# Patient Record
Sex: Female | Born: 1995 | Race: Black or African American | Hispanic: No | Marital: Single | Smoking: Former smoker
Health system: Southern US, Community
[De-identification: ages and names within clinical notes are randomized; demographics above are authoritative.]

## PROBLEM LIST (undated history)

## (undated) DIAGNOSIS — G4730 Sleep apnea, unspecified: Secondary | ICD-10-CM

## (undated) DIAGNOSIS — F32A Depression, unspecified: Secondary | ICD-10-CM

## (undated) DIAGNOSIS — F419 Anxiety disorder, unspecified: Secondary | ICD-10-CM

## (undated) DIAGNOSIS — Z789 Other specified health status: Secondary | ICD-10-CM

## (undated) DIAGNOSIS — R45851 Suicidal ideations: Secondary | ICD-10-CM

## (undated) HISTORY — DX: Depression, unspecified: F32.A

## (undated) HISTORY — DX: Sleep apnea, unspecified: G47.30

## (undated) HISTORY — DX: Anxiety disorder, unspecified: F41.9

## (undated) HISTORY — PX: NO PAST SURGERIES: SHX2092

---

## 2017-04-22 ENCOUNTER — Emergency Department (HOSPITAL_COMMUNITY)
Admission: EM | Admit: 2017-04-22 | Discharge: 2017-04-22 | Disposition: A | Discharge status: Home / Self Care | Payer: Self-pay | Attending: Emergency Medicine | Admitting: Emergency Medicine

## 2017-04-22 ENCOUNTER — Encounter (HOSPITAL_COMMUNITY): Payer: Self-pay | Admitting: *Deleted

## 2017-04-22 DIAGNOSIS — Z79899 Other long term (current) drug therapy: Secondary | ICD-10-CM | POA: Insufficient documentation

## 2017-04-22 DIAGNOSIS — J029 Acute pharyngitis, unspecified: Secondary | ICD-10-CM | POA: Insufficient documentation

## 2017-04-22 DIAGNOSIS — B309 Viral conjunctivitis, unspecified: Secondary | ICD-10-CM | POA: Insufficient documentation

## 2017-04-22 DIAGNOSIS — F17290 Nicotine dependence, other tobacco product, uncomplicated: Secondary | ICD-10-CM | POA: Insufficient documentation

## 2017-04-22 DIAGNOSIS — J069 Acute upper respiratory infection, unspecified: Secondary | ICD-10-CM | POA: Insufficient documentation

## 2017-04-22 LAB — RAPID STREP SCREEN (MED CTR MEBANE ONLY): STREPTOCOCCUS, GROUP A SCREEN (DIRECT): NEGATIVE

## 2017-04-22 MED ORDER — DM-GUAIFENESIN ER 30-600 MG PO TB12: 1.0000 | ORAL_TABLET | Freq: Two times a day (BID) | ORAL | 0 refills | Status: DC | PRN

## 2017-04-22 MED ORDER — FLUTICASONE PROPIONATE 50 MCG/ACT NA SUSP: 2.0000 | Freq: Every day | NASAL | 0 refills | Status: DC

## 2017-04-22 MED ORDER — BENZONATATE 100 MG PO CAPS: 100.0000 mg | ORAL_CAPSULE | Freq: Three times a day (TID) | ORAL | 0 refills | Status: DC | PRN

## 2017-04-22 NOTE — Discharge Instructions (Signed)
1. Medications: flonase, mucinex, tessalon, usual home medications 2. Treatment: rest, drink plenty of fluids, take tylenol or ibuprofen for fever control. Use warm saltwater rinses and honey to relieve throat. 3. Follow Up: Please followup with your primary doctor in 3 days for discussion of your diagnoses and further evaluation after today's visit; if you do not have a primary care doctor use the resource guide provided to find one; Return to the ER for high fevers, difficulty breathing or other concerning symptoms   Contact a health care provider if: You are getting worse rather than better. Your symptoms are not controlled by medicine. You have chills. You have worsening shortness of breath. You have brown or red mucus. You have yellow or brown nasal discharge. You have pain in your face, especially when you bend forward. You have a fever. You have swollen neck glands. You have pain while swallowing. You have white areas in the back of your throat. Get help right away if: You have severe or persistent: Headache. Ear pain. Sinus pain. Chest pain. You have chronic lung disease and any of the following: Wheezing. Prolonged cough. Coughing up blood. A change in your usual mucus. You have a stiff neck. You have changes in your: Vision. Hearing. Thinking. Mood.

## 2017-04-22 NOTE — ED Triage Notes (Signed)
Pt here for URI which began with a sore throat a week ago.  Pt is also having intermittent chills (no fever at this time).  Pt has a cough and a headache and has been feeling generally unwell.

## 2017-04-22 NOTE — ED Notes (Signed)
Pt reports lots of post nasal drip and seasonal allergies.  She states that "the pollen is the main issue"

## 2017-04-22 NOTE — ED Provider Notes (Signed)
[facility]-EMERGENCY DEPT Provider Note   CSN: [id number] Arrival date & time: 04/22/17  1153  History   Chief Complaint Chief Complaint  Patient presents with  . Sore Throat   HPI Comments: Haley Bentley is a 21 y.o. female who presents to the Emergency Department with multiple complaints. Pt primarily complains of a sore throat and sinus pressure x1 week, in addition to fever, nasal congestion and intermittent chills in the setting of positive sick contact with a child, nausea and a productive cough with yellow mucus. Also reports eye redness, with no pain, itching or swelling, since this morning. She denies experiencing any vomiting or abdominal pain. She has not used any OTC medications to treat her sx.  The history is provided by the patient and medical records. No language interpreter was used.   History reviewed. No pertinent past medical history.  There are no active problems to display for this patient.  History reviewed. No pertinent surgical history.  OB History    No data available      Home Medications    Prior to Admission medications   Medication Sig Start Date End Date Taking? Authorizing Provider  benzonatate (TESSALON) 100 MG capsule Take 1 capsule (100 mg total) by mouth 3 (three) times daily as needed for cough. 04/22/17   Delayza Lungren Manuel [place], [place]  dextromethorphan-guaiFENesin (MUCINEX DM) 30-600 MG 12hr tablet Take 1 tablet by mouth 2 (two) times daily as needed for cough. 04/22/17   Viral Schramm Manuel [place], [place]  fluticasone (FLONASE) 50 MCG/ACT nasal spray Place 2 sprays into both nostrils daily. 04/22/17   Roney Youtz Orson [place], [place]   Family History No family history on file.  Social History Social History  Substance Use Topics  . Smoking status: Current Some Day Smoker    Types: Cigars  . Smokeless tobacco: Never Used  . Alcohol use No    Allergies   Patient has no known allergies.  Review of Systems Review of Systems  Constitutional: Positive for  chills and fever. Negative for appetite change and fatigue.  HENT: Positive for congestion, sinus pain, sinus pressure and sore throat. Negative for ear discharge and ear pain.   Eyes: Positive for redness. Negative for photophobia, pain, discharge, itching and visual disturbance.  Respiratory: Positive for cough. Negative for shortness of breath and wheezing.   Cardiovascular: Negative for chest pain.  Gastrointestinal: Positive for nausea. Negative for abdominal pain, diarrhea and vomiting.  Genitourinary: Negative for difficulty urinating and dysuria.  Skin: Negative for rash.   Physical Exam Updated Vital Signs BP 105/75 (BP Location: Left Arm)   Pulse 91   Temp 98.7 F (37.1 C)   Resp 16   Wt 54.9 kg   LMP 04/20/2017   SpO2 100%   Physical Exam  Constitutional: She is oriented to person, place, and time. She appears well-developed and well-nourished. No distress.  Well appearing. Airway patent. No stridor, trismus, drooling. Handling secretions well.  HENT:  Head: Normocephalic and atraumatic.  Right Ear: External ear normal.  Left Ear: External ear normal.  Nose: Nose normal.  Mouth/Throat: Oropharynx is clear and moist. No oropharyngeal exudate.  Oropharynx without evidence of redness or exudates. Tonsils without evidence of obvious redness, swelling, or exudates. TM's appear normal with no evidence of bulging. EAC appear non erythematous and not swollen  Eyes: Conjunctivae and EOM are normal. Pupils are equal, round, and reactive to light.  Neck: Normal range of motion.  Normal ROM. No nuchal rigidity.   Cardiovascular: Normal  rate and normal heart sounds.   Pulmonary/Chest: Effort normal and breath sounds normal. No respiratory distress. She has no wheezes. She has no rales.  Lungs CTA. No wheezing. No rales. No stridor. Normal work of breathing  Abdominal: Soft. She exhibits no distension. There is no tenderness. There is no rebound and no guarding.  Soft and nontender.  No rebound. No guarding. Negative murphy's sign. No focal tenderness at McBurney's point. No CVA tenderness. No evidence of hernia  Lymphadenopathy:    She has no cervical adenopathy.  Neurological: She is alert and oriented to person, place, and time.  Skin: Skin is warm and dry. Capillary refill takes less than 2 seconds.  Psychiatric: She has a normal mood and affect. Her behavior is normal.  Nursing note and vitals reviewed.   ED Treatments / Results  DIAGNOSTIC STUDIES:  Oxygen Saturation is 100% on room air, normal by my interpretation.    COORDINATION OF CARE:  12:34 PM Discussed treatment plan with pt at bedside and pt agreed to plan.  Labs (all labs ordered are listed, but only abnormal results are displayed) Labs Reviewed  RAPID STREP SCREEN (NOT AT [facility])  CULTURE, GROUP A STREP [facility])   EKG  EKG Interpretation None      Radiology No results found.  Procedures Procedures (including critical care time)  Medications Ordered in ED Medications - No data to display   Initial Impression / Assessment and Plan / ED Course  I have reviewed the triage vital signs and the nursing notes.  Pertinent labs & imaging results that were available during my care of the patient were reviewed by me and considered in my medical decision making (see chart for details).    Pt symptoms consistent with URI, viral pharyngitis, and viral conjuntivitis. Pt with negative strep. No abx indicated at this time. Discussed that results of strep culture are pending and patient will be informed if positive result and abx will be called in at that time. Discharge with symptomatic tx. No evidence of dehydration. Pt is tolerating secretions. Normal visual acuity test. Presentation not concerning for peritonsillar abscess or spread of infection to deep spaces of the throat; patent airway.  No purulent eye discharge, corneal abrasions, entrapment, consensual photophobia. Presentation non-concerning for  iritis, bacterial conjunctivitis, corneal abrasions, or HSV.  No antibiotics are indicated.  Personal hygiene and frequent handwashing discussed. Specific return precautions discussed. Recommended PCP follow up. Pt appears safe for discharge.   Final Clinical Impressions(s) / ED Diagnoses   Final diagnoses:  Viral upper respiratory tract infection  Viral pharyngitis  Acute viral conjunctivitis of right eye    New Prescriptions New Prescriptions   BENZONATATE (TESSALON) 100 MG CAPSULE    Take 1 capsule (100 mg total) by mouth 3 (three) times daily as needed for cough.   DEXTROMETHORPHAN-GUAIFENESIN (MUCINEX DM) 30-600 MG 12HR TABLET    Take 1 tablet by mouth 2 (two) times daily as needed for cough.   FLUTICASONE (FLONASE) 50 MCG/ACT NASAL SPRAY    Place 2 sprays into both nostrils daily.   I personally performed the services described in this documentation, which was scribed in my presence. The recorded information has been reviewed and is accurate.  By signing my name below, I, Phillips Climes, attest that this documentation has been prepared under the direction and in the presence of Dorian Duval, [place]. Electronically Signed: Phillips Climes, Scribe. 04/22/2017. 12:56 PM.   [address] [place], [place] 04/22/17 1256    Ankit  Rhunette Croft, MD 04/23/17 0981

## 2017-04-25 LAB — CULTURE, GROUP A STREP (THRC)

## 2018-06-23 ENCOUNTER — Emergency Department (HOSPITAL_COMMUNITY)
Admission: EM | Admit: 2018-06-23 | Discharge: 2018-06-24 | Disposition: A | Discharge status: Other Acute Inpatient Hospital | Payer: Medicaid Other | Attending: Emergency Medicine | Admitting: Emergency Medicine

## 2018-06-23 ENCOUNTER — Encounter (HOSPITAL_COMMUNITY): Payer: Self-pay

## 2018-06-23 ENCOUNTER — Other Ambulatory Visit: Payer: Self-pay

## 2018-06-23 DIAGNOSIS — F322 Major depressive disorder, single episode, severe without psychotic features: Secondary | ICD-10-CM | POA: Insufficient documentation

## 2018-06-23 DIAGNOSIS — F17290 Nicotine dependence, other tobacco product, uncomplicated: Secondary | ICD-10-CM | POA: Insufficient documentation

## 2018-06-23 DIAGNOSIS — R45851 Suicidal ideations: Secondary | ICD-10-CM | POA: Insufficient documentation

## 2018-06-23 LAB — COMPREHENSIVE METABOLIC PANEL
ALBUMIN: 4.4 g/dL (ref 3.5–5.0)
ALT: 12 U/L (ref 0–44)
AST: 20 U/L (ref 15–41)
Alkaline Phosphatase: 56 U/L (ref 38–126)
Anion gap: 8 (ref 5–15)
BILIRUBIN TOTAL: 0.7 mg/dL (ref 0.3–1.2)
BUN: 8 mg/dL (ref 6–20)
CO2: 26 mmol/L (ref 22–32)
CREATININE: 0.78 mg/dL (ref 0.44–1.00)
Calcium: 9.4 mg/dL (ref 8.9–10.3)
Chloride: 105 mmol/L (ref 98–111)
GFR calc Af Amer: >60 mL/min (ref >60)
GLUCOSE: 91 mg/dL (ref 70–99)
Potassium: 4 mmol/L (ref 3.5–5.1)
Sodium: 139 mmol/L (ref 135–145)
TOTAL PROTEIN: 7.4 g/dL (ref 6.5–8.1)

## 2018-06-23 LAB — CBC
HEMATOCRIT: 42.3 % (ref 36.0–46.0)
Hemoglobin: 13.1 g/dL (ref 12.0–15.0)
MCH: 32 pg (ref 26.0–34.0)
MCHC: 31 g/dL (ref 30.0–36.0)
MCV: 103.4 fL — AB (ref 78.0–100.0)
PLATELETS: 164 10*3/uL (ref 150–400)
RBC: 4.09 MIL/uL (ref 3.87–5.11)
RDW: 11.9 % (ref 11.5–15.5)
WBC: 5.7 10*3/uL (ref 4.0–10.5)

## 2018-06-23 LAB — ETHANOL

## 2018-06-23 LAB — I-STAT BETA HCG BLOOD, ED (MC, WL, AP ONLY): I-stat hCG, quantitative: <5 m[IU]/mL (ref <5)

## 2018-06-23 LAB — ACETAMINOPHEN LEVEL: Acetaminophen (Tylenol), Serum: <10 ug/mL — ABNORMAL LOW (ref 10–30)

## 2018-06-23 LAB — SALICYLATE LEVEL: Salicylate Lvl: <7 mg/dL (ref 2.8–30.0)

## 2018-06-23 MED ORDER — ZOLPIDEM TARTRATE 5 MG PO TABS: 5.0000 mg | ORAL_TABLET | Freq: Every evening | ORAL | Status: DC | PRN

## 2018-06-23 MED ORDER — ONDANSETRON HCL 4 MG PO TABS: 4.0000 mg | ORAL_TABLET | Freq: Three times a day (TID) | ORAL | Status: DC | PRN

## 2018-06-23 MED ORDER — ALUM & MAG HYDROXIDE-SIMETH 200-200-20 MG/5ML PO SUSP: 30.0000 mL | Freq: Four times a day (QID) | ORAL | Status: DC | PRN

## 2018-06-23 MED ORDER — NICOTINE 21 MG/24HR TD PT24: 21.0000 mg | MEDICATED_PATCH | Freq: Every day | TRANSDERMAL | Status: DC

## 2018-06-23 MED ORDER — ACETAMINOPHEN 325 MG PO TABS: 650.0000 mg | ORAL_TABLET | ORAL | Status: DC | PRN

## 2018-06-23 NOTE — ED Provider Notes (Signed)
[facility] Provider [facility]   CSN: [id number] Arrival date & time: 06/23/18  1735     History   Chief Complaint Chief Complaint  Patient presents with  . Suicidal    HPI Haley Bentley is a 22 y.o. female with a hx of tobacco abuse who presents to the ED with suicidal ideations which have been occurring intermittently for 1 month.  Patient states that thoughts of hurting herself, no specific plan.  She states these thoughts of been progressively worsening.  States when she gets angry she does not know how to handle it.  She states that her boyfriend broke up about a month ago, and she has been stressed in general.  No other specific alleviating or aggravating factors.  Denies previous suicide attempt.  Denies homicidal ideation.  Denies hallucinations.  Denies fever, chills, vomiting, abdominal pain, chest pain, or trouble breathing. HPI  History reviewed. No pertinent past medical history.  There are no active problems to display for this patient.   History reviewed. No pertinent surgical history.   OB History   None      Home Medications    Prior to Admission medications   Medication Sig Start Date End Date Taking? Authorizing Provider  benzonatate (TESSALON) 100 MG capsule Take 1 capsule (100 mg total) by mouth 3 (three) times daily as needed for cough. Patient not taking: Reported on 06/23/2018 04/22/17   Alvina ChouEspina, Francisco Manuel, [place]  dextromethorphan-guaiFENesin [facility](MUCINEX DM) 30-600 MG 12hr tablet Take 1 tablet by mouth 2 (two) times daily as needed for cough. Patient not taking: Reported on 06/23/2018 04/22/17   Alvina ChouEspina, Francisco Manuel, [place]  fluticasone [facility](FLONASE) 50 MCG/ACT nasal spray Place 2 sprays into both nostrils daily. Patient not taking: Reported on 06/23/2018 04/22/17   Alvina ChouEspina, Francisco Manuel, [place]    Family History No family history on file.  Social History Social History   Tobacco Use  . Smoking status: Current Some Day  Smoker    Types: Cigars  . Smokeless tobacco: Never Used  Substance Use Topics  . Alcohol use: No  . Drug use: Yes     Allergies   Patient has no known allergies.   Review of Systems Review of Systems  Constitutional: Negative for chills and fever.  Respiratory: Negative for shortness of breath.   Cardiovascular: Negative for chest pain.  Gastrointestinal: Negative for abdominal pain.  Psychiatric/Behavioral: Positive for suicidal ideas. Negative for hallucinations.  All other systems reviewed and are negative.    Physical Exam Updated Vital Signs BP 103/65 (BP Location: Right Arm)   Pulse 61   Temp 97.7 F (36.5 C) (Oral)   Resp 16   Ht 5\' 5"  (1.651 m)   Wt 59 kg (130 lb)   LMP 06/09/2018   SpO2 100%   BMI 21.63 kg/m   Physical Exam  Constitutional: She appears well-developed and well-nourished. No distress.  HENT:  Head: Normocephalic and atraumatic.  Eyes: Conjunctivae are normal. Right eye exhibits no discharge. Left eye exhibits no discharge.  Cardiovascular: Normal rate and regular rhythm.  No murmur heard. Pulmonary/Chest: Breath sounds normal. No respiratory distress. She has no wheezes. She has no rales.  Abdominal: Soft. She exhibits no distension. There is no tenderness.  Neurological: She is alert.  Clear speech.   Skin: Skin is warm and dry. No rash noted.  Psychiatric: She has a normal mood and affect. Her speech is normal and behavior is normal. She expresses suicidal (Intermittently) ideation. She  expresses no homicidal ideation. She expresses no suicidal plans and no homicidal plans.  Nursing note and vitals reviewed.    ED Treatments / Results  Labs Results for orders placed or performed during the hospital encounter of 06/23/18  Comprehensive metabolic panel  Result Value Ref Range   Sodium 139 135 - 145 mmol/L   Potassium 4.0 3.5 - 5.1 mmol/L   Chloride 105 98 - 111 mmol/L   CO2 26 22 - 32 mmol/L   Glucose, Bld 91 70 - 99 mg/dL    BUN 8 6 - 20 mg/dL   Creatinine, Ser 1.61 0.44 - 1.00 mg/dL   Calcium 9.4 8.9 - 09.6 mg/dL   Total Protein 7.4 6.5 - 8.1 g/dL   Albumin 4.4 3.5 - 5.0 g/dL   AST 20 15 - 41 U/L   ALT 12 0 - 44 U/L   Alkaline Phosphatase 56 38 - 126 U/L   Total Bilirubin 0.7 0.3 - 1.2 mg/dL   GFR calc non Af Amer >60 >60 mL/min   GFR calc Af Amer >60 >60 mL/min   Anion gap 8 5 - 15  Ethanol  Result Value Ref Range   Alcohol, Ethyl (B) <10 <10 mg/dL  Salicylate level  Result Value Ref Range   Salicylate Lvl <7.0 2.8 - 30.0 mg/dL  Acetaminophen level  Result Value Ref Range   Acetaminophen (Tylenol), Serum <10 (L) 10 - 30 ug/mL  cbc  Result Value Ref Range   WBC 5.7 4.0 - 10.5 K/uL   RBC 4.09 3.87 - 5.11 MIL/uL   Hemoglobin 13.1 12.0 - 15.0 g/dL   HCT 04.5 40.9 - 81.1 %   MCV 103.4 (H) 78.0 - 100.0 fL   MCH 32.0 26.0 - 34.0 pg   MCHC 31.0 30.0 - 36.0 g/dL   RDW 91.4 78.2 - 95.6 %   Platelets 164 150 - 400 K/uL  I-Stat beta hCG blood, ED  Result Value Ref Range   I-stat hCG, quantitative <5.0 <5 mIU/mL   Comment 3           No results found. EKG None  Radiology No results found.  Procedures Procedures (including critical care time)  Medications Ordered in ED Medications - No data to display   Initial Impression / Assessment and Plan / ED Course  I have reviewed the triage vital signs and the nursing notes.  Pertinent labs & imaging results that were available during my care of the patient were reviewed by me and considered in my medical decision making (see chart for details).   Patient presents with complaint of progressively worsening intermittent suicidal ideation.  Patient nontoxic-appearing, no apparent distress, vitals WNL.  Labs reviewed and grossly unremarkable.  Patient is medically clear for TTS evaluation.  Psych holding orders have been placed. Disposition per behavioral health.   Final Clinical Impressions(s) / ED Diagnoses   Final diagnoses:  Suicidal ideation     ED Discharge Orders    None       Desmond Lope 06/23/18 2042    Pricilla Loveless, MD 06/24/18 1710

## 2018-06-23 NOTE — ED Triage Notes (Signed)
Pt states she is newly homeless and having a lot of anxiety, feeling jumpy and feeling suicidal. States she has been feeling suicidal for one month, denies having a plan. Pt reports that she use to cut herself "a long time ago but not recently".
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
## 2018-06-23 NOTE — ED Notes (Addendum)
[facility], states that pt is recommended for inpatient treatment.

## 2018-06-23 NOTE — ED Provider Notes (Signed)
Patient placed in Quick Look pathway, seen and evaluated   Chief Complaint: + anxiety, depression, feeling suicidal/   HPI:   Patient states she has racing thoughts she cannot stop. Difficulty sleeping. Feeling suicidal. Denies planno previous hospitalization.  ROS:  anxiety (one)  Physical Exam:   Gen: No distress  Neuro: Awake and Alert  Skin: Warm    Focused Exam:RRR, CTAB   Initiation of care has begun. The patient has been counseled on the process, plan, and necessity for staying for the completion/evaluation, and the remainder of the medical screening examination    Arthor CaptainHarris, Shanyiah Conde, Cordelia Poche-C 06/23/18 Su [id number]    Mesner, Jason, MD 06/24/18 [phone]

## 2018-06-23 NOTE — ED Notes (Signed)
Pt has been wanded by security. 

## 2018-06-23 NOTE — ED Notes (Signed)
[facility] in 2 wheelchairs

## 2018-06-23 NOTE — ED Notes (Signed)
Pt belongings in 2 wheelchairs [facility]. Has not been inventoried yet.

## 2018-06-23 NOTE — ED Notes (Signed)
Father would like to speak with daughter. plz call Ellis SavageLamonta Clark @ [phone]

## 2018-06-24 ENCOUNTER — Inpatient Hospital Stay (HOSPITAL_COMMUNITY)
Admission: AD | Admit: 2018-06-24 | Discharge: 2018-06-28 | DRG: 885 | Disposition: A | Discharge status: Home / Self Care | Payer: Medicaid Other | Source: Intra-hospital | Attending: Psychiatry | Admitting: Psychiatry

## 2018-06-24 ENCOUNTER — Encounter (HOSPITAL_COMMUNITY): Payer: Self-pay

## 2018-06-24 DIAGNOSIS — Z818 Family history of other mental and behavioral disorders: Secondary | ICD-10-CM

## 2018-06-24 DIAGNOSIS — F909 Attention-deficit hyperactivity disorder, unspecified type: Secondary | ICD-10-CM | POA: Diagnosis present

## 2018-06-24 DIAGNOSIS — F17290 Nicotine dependence, other tobacco product, uncomplicated: Secondary | ICD-10-CM | POA: Diagnosis present

## 2018-06-24 DIAGNOSIS — R4587 Impulsiveness: Secondary | ICD-10-CM | POA: Diagnosis present

## 2018-06-24 DIAGNOSIS — F1290 Cannabis use, unspecified, uncomplicated: Secondary | ICD-10-CM | POA: Diagnosis not present

## 2018-06-24 DIAGNOSIS — G4700 Insomnia, unspecified: Secondary | ICD-10-CM | POA: Diagnosis not present

## 2018-06-24 DIAGNOSIS — F4310 Post-traumatic stress disorder, unspecified: Secondary | ICD-10-CM | POA: Diagnosis present

## 2018-06-24 DIAGNOSIS — R450 Nervousness: Secondary | ICD-10-CM

## 2018-06-24 DIAGNOSIS — F332 Major depressive disorder, recurrent severe without psychotic features: Secondary | ICD-10-CM | POA: Diagnosis present

## 2018-06-24 DIAGNOSIS — Z62810 Personal history of physical and sexual abuse in childhood: Secondary | ICD-10-CM | POA: Diagnosis present

## 2018-06-24 DIAGNOSIS — R45851 Suicidal ideations: Secondary | ICD-10-CM | POA: Diagnosis present

## 2018-06-24 HISTORY — DX: Other specified health status: Z78.9

## 2018-06-24 LAB — RAPID URINE DRUG SCREEN, HOSP PERFORMED
Amphetamines: NOT DETECTED
Benzodiazepines: NOT DETECTED
COCAINE: NOT DETECTED
OPIATES: NOT DETECTED
TETRAHYDROCANNABINOL: POSITIVE — AB

## 2018-06-24 LAB — LIPID PANEL
CHOL/HDL RATIO: 2.7 ratio
CHOLESTEROL: 149 mg/dL (ref 0–200)
HDL: 55 mg/dL (ref >40)
LDL Cholesterol: 87 mg/dL (ref 0–99)
Triglycerides: 36 mg/dL (ref <150)
VLDL: 7 mg/dL (ref 0–40)

## 2018-06-24 LAB — HEMOGLOBIN A1C
Hgb A1c MFr Bld: 4.5 % — ABNORMAL LOW (ref 4.8–5.6)
Mean Plasma Glucose: 82.45 mg/dL

## 2018-06-24 LAB — TSH: TSH: 0.648 u[IU]/mL (ref 0.350–4.500)

## 2018-06-24 MED ORDER — TRAZODONE HCL 50 MG PO TABS: 50.0000 mg | ORAL_TABLET | Freq: Every evening | ORAL | Status: DC | PRN

## 2018-06-24 MED ORDER — ACETAMINOPHEN 325 MG PO TABS
650.0000 mg | ORAL_TABLET | Freq: Four times a day (QID) | ORAL | Status: DC | PRN
Start: 2018-06-24 — End: 2018-06-29

## 2018-06-24 MED ORDER — MAGNESIUM HYDROXIDE 400 MG/5ML PO SUSP: 30.0000 mL | Freq: Every day | ORAL | Status: DC | PRN

## 2018-06-24 MED ORDER — MIRTAZAPINE 15 MG PO TABS
15.0000 mg | ORAL_TABLET | Freq: Every day | ORAL | Status: DC
  Administered 2018-06-25 – 2018-06-27 (×2): 15 mg via ORAL
  Filled 2018-06-24 (×3): qty 1
  Filled 2018-06-24: qty 7
  Filled 2018-06-24 (×3): qty 1

## 2018-06-24 MED ORDER — ADULT MULTIVITAMIN W/MINERALS CH
1.0000 | ORAL_TABLET | Freq: Every day | ORAL | Status: DC
  Administered 2018-06-24 – 2018-06-28 (×5): 1 via ORAL
  Filled 2018-06-24 (×9): qty 1

## 2018-06-24 MED ORDER — HYDROXYZINE HCL 25 MG PO TABS
25.0000 mg | ORAL_TABLET | Freq: Three times a day (TID) | ORAL | Status: DC | PRN
  Administered 2018-06-24 – 2018-06-27 (×2): 25 mg via ORAL
  Filled 2018-06-24 (×2): qty 1

## 2018-06-24 MED ORDER — ALUM & MAG HYDROXIDE-SIMETH 200-200-20 MG/5ML PO SUSP
30.0000 mL | ORAL | Status: DC | PRN
  Administered 2018-06-24: 30 mL via ORAL
  Filled 2018-06-24: qty 30

## 2018-06-24 NOTE — [facility] Group Notes (Signed)
[facility] Group Notes:  (Nursing/MHT/Case Management/Adjunct)  Date:  06/24/2018  Time:  5:53 PM  Type of Therapy:  Psychoeducational Skills  Participation Level:  Did Not Attend    Haley Bentley 06/24/2018, 5:53 PM

## 2018-06-24 NOTE — Tx Team (Signed)
Initial Treatment Plan 06/24/2018 3:26 AM Haley Dione PloverA Tavenner [id number]    PATIENT STRESSORS: Educational concerns Financial difficulties Loss of relationship Occupational concerns   PATIENT STRENGTHS: Ability for insight Active sense of humor Average or above average intelligence   PATIENT IDENTIFIED PROBLEMS: "I want to talk about my pain."  "I need help with my anxiety/depression."  "I need coping skills for good communication."                 DISCHARGE CRITERIA:  Ability to meet basic life and health needs Adequate post-discharge living arrangements Improved stabilization in mood, thinking, and/or behavior  PRELIMINARY DISCHARGE PLAN: Placement in alternative living arrangements  PATIENT/FAMILY INVOLVEMENT: This treatment plan has been presented to and reviewed with the patient, Haley A Riese.  The patient and family have been given the opportunity to ask questions and make suggestions.  Jonetta SpeakAshton E Royden Bulman, RN 06/24/2018, 3:26 AM

## 2018-06-24 NOTE — Plan of Care (Signed)
  Problem: Activity: Goal: Interest or engagement in activities will improve Outcome: Not Progressing   Problem: Safety: Goal: Periods of time without injury will increase Outcome: Progressing   Problem: Safety: Goal: Periods of time without injury will increase Outcome: Progressing DAR NOTE: Patient presents with anxious affect and depressed mood.  Denies suicidal thoughts, auditory and visual hallucinations.  Rates depression at 7, hopelessness at 6, and anxiety at 10.  Maintained on routine safety checks.  Medications given as prescribed.  Support and encouragement offered as needed.  Attended group and participated.  States goal for today is "lighten up my mood and set more goals."  Patient remained in her room most of this shift.  Minimal interaction with staff and peers.  Vistaril 25 mg given for complain of anxiety with good effect.
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
## 2018-06-24 NOTE — [facility] Group Notes (Signed)
Adult Psychoeducational Group Note  Date:  06/24/2018 Time:  4:19 PM  Group Topic/Focus:  Goals Group:   The focus of this group is to help patients establish daily goals to achieve during treatment and discuss how the patient can incorporate goal setting into their daily lives to aide in recovery.  Participation Level:  Active  Participation Quality:  Appropriate  Affect:  Appropriate  Cognitive:  Alert  Insight: Good  Engagement in Group:  Engaged  Modes of Intervention:  Discussion  Additional Comments: Patient did not attend orientation/goal group, but was encouraged to do so. Although, patient did not attend goals group patient did attend psycho-ed group and participated as well as engaged in group activity on crisis management in the adult unit workbook. Patient was able to identify feelings leading up to a crisis situation.      Brayton LaymanBreannah R Deosha Werden 06/24/2018, 4:19 PM

## 2018-06-24 NOTE — Progress Notes (Signed)
Patient presents with sad/flat/depressed/guarded affect and behavior during admission interview and assessment. VS monitored and recorded. [facility] and revealed skin clean, dry, and intact. Contraband was not found. Patient was oriented to unit and schedule. Pt states ""I want to talk about my pain. I need help with my anxiety/depression. I need coping skills for good communication.". Pt denies SI/HI/AVH at this time. PO fluids provided. Safety maintained. Rest encouraged.
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
## 2018-06-24 NOTE — [facility] Suicide Risk Assessment (Signed)
[facility] INPATIENT:  Family/Significant Other Suicide Prevention Education  Suicide Prevention Education:   Education Completed; Haley Bentley/Boyfriend, has been identified by the patient as the family member/significant other with whom the patient will be residing, and identified as the person(s) who will aid the patient in the event of a mental health crisis (suicidal ideations/suicide attempt).  With written consent from the patient, the family member/significant other has been provided the following suicide prevention education, prior to the and/or following the discharge of the patient.  The suicide prevention education provided includes the following:  Suicide risk factors  Suicide prevention and interventions  National Suicide Hotline telephone number  [facility] assessment telephone number  [facility] Emergency Assistance 911  [facility] and/or Residential Mobile Crisis Unit telephone number  Request made of family/significant other to:  Remove weapons (e.g., guns, rifles, knives), all items previously/currently identified as safety concern.    Remove drugs/medications (over-the-counter, prescriptions, illicit drugs), all items previously/currently identified as a safety concern.  The family member/significant other verbalizes understanding of the suicide prevention education information provided.  The family member/signthificant other agrees to remove the items of safety concern listed above. Boyfriend stated there are no guns or weapons in the home. He stated that patient has been depressed for a little while and he is concerned. He stated that he would like for patient to receive outpatient therapy when she discharges.   Haley Bentley, MSW, LCSW Clinical Social Work 06/24/2018, 1:47 PM

## 2018-06-24 NOTE — Progress Notes (Signed)
NUTRITION ASSESSMENT  Pt identified as at risk on the Malnutrition Screen Tool  INTERVENTION: - Supplements: will order daily multivitamin with minerals.   NUTRITION DIAGNOSIS: Inadequate oral intake related to decreased appetite and interest in eating as evidenced by patient report.   Goal: Pt to meet >/= 90% of their estimated nutrition needs.  Monitor:  PO intake  Assessment:  Patient admitted for severe anxiety and SI for the past 1 month. She has also been experiencing racing thoughts with associated inability to sleep, hopelessness, decreased concentration, fatigue, irritability, and decreased appetite and intakes. She reports being newly homeless (since admission).  Per chart review, limited weight hx available. Continue to encourage PO intakes of meals and snacks.    22 y.o. female  Height: Ht Readings from Last 1 Encounters:  06/24/18 5\' 5"  (1.651 m)    Weight: Wt Readings from Last 1 Encounters:  06/24/18 129 lb 15.7 oz (59 kg)    Weight Hx: Wt Readings from Last 10 Encounters:  06/24/18 129 lb 15.7 oz (59 kg)  06/23/18 130 lb (59 kg)  04/22/17 121 lb (54.9 kg) (37 %, Z= -0.33)*   * Growth percentiles are based on CDC (Girls, 2-20 Years) data.    BMI:  Body mass index is 21.63 kg/m. Pt meets criteria for normal based on current BMI.  Estimated Nutritional Needs: Kcal: 25-30 kcal/kg Protein: > 1 gram protein/kg Fluid: 1 ml/kcal  Diet Order:  Diet Order           Diet regular Room service appropriate? Yes; Fluid consistency: Thin  Diet effective now         Pt is also offered choice of unit snacks mid-morning and mid-afternoon.  Pt is eating as desired.   Lab results and medications reviewed.     Trenton GammonJessica Toris Laverdiere, MS, RD, LDN, [facility] Inpatient Clinical Dietitian Pager # [phone] After hours/weekend pager # [phone]

## 2018-06-24 NOTE — ED Notes (Signed)
[facility] called and pt has been accepted. Will be going to room 405-1. Dr. Jama Flavorsobos accepting. Pt signed voluntary admission form.
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
## 2018-06-25 NOTE — Progress Notes (Signed)
The focus of this group is to help patients review their daily goal of treatment and discuss progress on daily workbooks. Pt did not attend the evening group, though she was invited. "I can't go. I need to shower."

## 2018-06-25 NOTE — Progress Notes (Signed)
D Pt I seen OOB UAL on the 400 hall today. She is shy, she is quiet, she endorses a flat and depressed affect. She avoids eye contact with this Clinical research associatewriter.    A She does complete her daily assessment and on this she wrote she denied SI today and she rated her depression, hopelessness and anxeity " 8/5/7", respectively. She said she felt " bad" this evening and was allowed to stay in her bed for dinner and a meal was brought back to her by staff.    R Safety is in place.

## 2018-06-25 NOTE — Progress Notes (Signed)
D: Patient observed on telephone and states she talked with her family. Boyfriend also visited. Patient states she is feeling more hopeful, optimistic based on these interactions. Patient's affect animated, superficial with congruent mood. Some anxiety noted.  Denies pain, physical complaints.   A: Medicated per orders, no prns requested or required. Medication education provided. Level III obs in place for safety. Emotional support offered. Patient encouraged to complete Suicide Safety Plan before discharge. Encouraged to attend and participate in unit programming.     R: Patient verbalizes understanding of POC. Patient denies SI/HI/AVH and remains safe on level III obs. Will continue to monitor throughout the night.

## 2018-06-25 NOTE — Progress Notes (Signed)
Recreation Therapy Notes  Date: 6.28.19 Time: 0930 Location: 300 [address] Dayroom  Group Topic: Stress Management  Goal Area(s) Addresses:  Patient will verbalize importance of using healthy stress management.  Patient will identify positive emotions associated with healthy stress management.   Intervention: Stress Management  Activity :  [employer].  LRT introduced the stress management technique of meditation.  LRT read a script on how mountains stay the same in spite of the things that go on around them.  Education:  Stress Management, Discharge Planning.   Education Outcome: Acknowledges edcuation/In group clarification offered/Needs additional education  Clinical Observations/Feedback: Pt did not attend group.    Caroll RancherMarjette Oleda Borski, LRT/CTRS         Caroll RancherLindsay, Romonia Yanik A 06/25/2018 12:23 PM

## 2018-06-25 NOTE — Plan of Care (Signed)
  Problem: Education: Goal: Emotional status will improve Outcome: Progressing   

## 2018-06-25 NOTE — Progress Notes (Signed)
Nursing Progress Note: 7p-7a D: Pt currently presents with a anxious/cooperative/sad/sullen affect and behavior. Not interacting with the milieu. Pt reports good sleep during the previous night with current medication regimen.   A: Pt provided with medications per providers orders. Pt's labs and vitals were monitored throughout the night. Pt supported emotionally and encouraged to express concerns and questions. Pt educated on medications.  R: Pt's safety ensured with 15 minute and environmental checks. Pt currently denies SI, HI, and AVH. Pt verbally contracts to seek staff if SI,HI, or AVH occurs and to consult with staff before acting on any harmful thoughts. Will continue to monitor.

## 2018-06-25 NOTE — Tx Team (Signed)
Interdisciplinary Treatment and Diagnostic Plan Update  06/25/2018 Time of Session: 9:10am Haley Bentley MRN: [id number]  Principal Diagnosis: Severe recurrent major depression without psychotic features [facility])  Secondary Diagnoses: Principal Problem:   Severe recurrent major depression without psychotic features ([place])   Current Medications:  Current Facility-Administered Medications  Medication Dose Route Frequency Provider Last Rate Last Dose  . acetaminophen (TYLENOL) tablet 650 mg  650 mg Oral Q6H PRN Lindon Romp A, NP      . alum & mag hydroxide-simeth (MAALOX/MYLANTA) 200-200-20 MG/5ML suspension 30 mL  30 mL Oral Q4H PRN Lindon Romp A, NP   30 mL at 06/24/18 1525  . hydrOXYzine (ATARAX/VISTARIL) tablet 25 mg  25 mg Oral TID PRN Lindon Romp A, NP   25 mg at 06/24/18 1523  . magnesium hydroxide (MILK OF MAGNESIA) suspension 30 mL  30 mL Oral Daily PRN Lindon Romp A, NP      . mirtazapine (REMERON) tablet 15 mg  15 mg Oral QHS Cobos, Fernando A, MD      . multivitamin with minerals tablet 1 tablet  1 tablet Oral Daily Cobos, Myer Peer, MD   1 tablet at 06/25/18 0539   PTA Medications: No medications prior to admission.    Patient Stressors: Network engineer difficulties Loss of relationship Occupational concerns  Patient Strengths: Ability for insight Active sense of humor Average or above average intelligence  Treatment Modalities: Medication Management, Group therapy, Case management,  1 to 1 session with clinician, Psychoeducation, Recreational therapy.   Physician Treatment Plan for Primary Diagnosis: Severe recurrent major depression without psychotic features ([place]) Long Term Goal(s): Improvement in symptoms so as ready for discharge Improvement in symptoms so as ready for discharge   Short Term Goals: Ability to verbalize feelings will improve Ability to demonstrate self-control will improve Ability to identify and develop effective coping  behaviors will improve Compliance with prescribed medications will improve  Medication Management: Evaluate patient's response, side effects, and tolerance of medication regimen.  Therapeutic Interventions: 1 to 1 sessions, Unit Group sessions and Medication administration.  Evaluation of Outcomes: Not Met  Physician Treatment Plan for Secondary Diagnosis: Principal Problem:   Severe recurrent major depression without psychotic features ([place])  Long Term Goal(s): Improvement in symptoms so as ready for discharge Improvement in symptoms so as ready for discharge   Short Term Goals: Ability to verbalize feelings will improve Ability to demonstrate self-control will improve Ability to identify and develop effective coping behaviors will improve Compliance with prescribed medications will improve     Medication Management: Evaluate patient's response, side effects, and tolerance of medication regimen.  Therapeutic Interventions: 1 to 1 sessions, Unit Group sessions and Medication administration.  Evaluation of Outcomes: Not Met   RN Treatment Plan for Primary Diagnosis: Severe recurrent major depression without psychotic features ([place]) Long Term Goal(s): Knowledge of disease and therapeutic regimen to maintain health will improve  Short Term Goals: Ability to disclose and discuss suicidal ideas, Ability to identify and develop effective coping behaviors will improve and Compliance with prescribed medications will improve  Medication Management: RN will administer medications as ordered by provider, will assess and evaluate patient's response and provide education to patient for prescribed medication. RN will report any adverse and/or side effects to prescribing provider.  Therapeutic Interventions: 1 on 1 counseling sessions, Psychoeducation, Medication administration, Evaluate responses to treatment, Monitor vital signs and CBGs as ordered, Perform/monitor CIWA, COWS, AIMS and Fall Risk  screenings as ordered, Perform wound care treatments as  ordered.  Evaluation of Outcomes: Not Met   LCSW Treatment Plan for Primary Diagnosis: Severe recurrent major depression without psychotic features ([place]) Long Term Goal(s): Safe transition to appropriate next level of care at discharge, Engage patient in therapeutic group addressing interpersonal concerns.  Short Term Goals: Engage patient in aftercare planning with referrals and resources  Therapeutic Interventions: Assess for all discharge needs, 1 to 1 time with Social worker, Explore available resources and support systems, Assess for adequacy in community support network, Educate family and significant other(s) on suicide prevention, Complete Psychosocial Assessment, Interpersonal group therapy.  Evaluation of Outcomes: Not Met   Progress in Treatment: Attending groups: No. Participating in groups: No. Taking medication as prescribed: Yes. Toleration medication: Yes. Family/Significant other contact made: No, will contact:  if patient consents Patient understands diagnosis: Yes. Discussing patient identified problems/goals with staff: Yes. Medical problems stabilized or resolved: Yes. Denies suicidal/homicidal ideation: Yes. Issues/concerns per patient self-inventory: No. Other:   New problem(s) identified: None   New Short Term/Long Term Goal(s):medication stabilization, elimination of SI thoughts, development of comprehensive mental wellness plan.    Patient Goals:  "I need help with my anxiety and depression and I need coping skills for good communication"  Discharge Plan or Barriers: CSW will assess for appropriate referrals.   Reason for Continuation of Hospitalization: Anxiety Depression Medication stabilization Suicidal ideation  Estimated Length of Stay:3-5 days   Attendees: Patient: Haley Bentley  06/25/2018 4:18 PM  Physician: Dr. Neita Garnet, MD 06/25/2018 4:18 PM  Nursing: Chong Sicilian. D, RN 06/25/2018  4:18 PM  RN Care Manager:X 06/25/2018 4:18 PM  Social Worker: Radonna Ricker, [place] 06/25/2018 4:18 PM  Recreational Therapist: X 06/25/2018 4:18 PM  Other: X 06/25/2018 4:18 PM  Other: X 06/25/2018 4:18 PM  Other:x 06/25/2018 4:18 PM    Scribe for Treatment Team: Marylee Floras, [place] 06/25/2018 4:18 PM

## 2018-06-25 NOTE — Progress Notes (Signed)
[facility] MD Progress Note  06/25/2018 4:10 PM Haley Bentley  MRN:  [id number] Subjective: Patient reports feeling depressed, sad, but denies any suicidal ideations and contracts for safety on unit.  States she spoke with her boyfriend on the phone, feels they have a good relationship, and plans to return to live with boyfriend on discharge.  In addition to depression/sadness describes brief, short-lived episodes of explosiveness and anger which she states are exclusively directed at the boyfriend and for which she feels quite guilty. Objective: I have discussed case with treatment team and have met with patient. 22 year old female, lives with boyfriend, presented due to worsening depression and suicidal ideations. Today reports she is still feeling depressed, sad, reports she feels guilty about frequent arguments with her boyfriend, denies suicidal ideations and contracts for safety on unit.  Describes episodes of explosiveness/angry outbursts but at this time presents calm , behavior in control . Denies medication side effects ( on Remeron) .  Patient visible on unit but limited interaction with others, tends to keep to self. Denies suicidal ideations.  Principal Problem: Severe recurrent major depression without psychotic features ([place]) Diagnosis:   Patient Active Problem List   Diagnosis Date Noted  . Severe recurrent major depression without psychotic features ([place]) [F33.2] 06/24/2018   Total Time spent with patient: 20 minutes  Past Psychiatric History:   Past Medical History:  Past Medical History:  Diagnosis Date  . Medical history non-contributory     Past Surgical History:  Procedure Laterality Date  . NO PAST SURGERIES     Family History: History reviewed. No pertinent family history. Family Psychiatric  History:  Social History:  Social History   Substance and Sexual Activity  Alcohol Use No     Social History   Substance and Sexual Activity  Drug Use Yes    Social  History   Socioeconomic History  . Marital status: Single    Spouse name: Not on file  . Number of children: Not on file  . Years of education: Not on file  . Highest education level: Not on file  Occupational History  . Not on file  Social Needs  . Financial resource strain: Not on file  . Food insecurity:    Worry: Not on file    Inability: Not on file  . Transportation needs:    Medical: Not on file    Non-medical: Not on file  Tobacco Use  . Smoking status: Current Some Day Smoker    Types: Cigars  . Smokeless tobacco: Never Used  Substance and Sexual Activity  . Alcohol use: No  . Drug use: Yes  . Sexual activity: Not on file  Lifestyle  . Physical activity:    Days per week: Not on file    Minutes per session: Not on file  . Stress: Not on file  Relationships  . Social connections:    Talks on phone: Not on file    Gets together: Not on file    Attends religious service: Not on file    Active member of club or organization: Not on file    Attends meetings of clubs or organizations: Not on file    Relationship status: Not on file  Other Topics Concern  . Not on file  Social History Narrative  . Not on file   Additional Social History:   Sleep: Good  Appetite:  Fair  Current Medications: Current Facility-Administered Medications  Medication Dose Route Frequency Provider Last Rate Last Dose  .  acetaminophen (TYLENOL) tablet 650 mg  650 mg Oral Q6H PRN Lindon Romp A, NP      . alum & mag hydroxide-simeth (MAALOX/MYLANTA) 200-200-20 MG/5ML suspension 30 mL  30 mL Oral Q4H PRN Lindon Romp A, NP   30 mL at 06/24/18 1525  . hydrOXYzine (ATARAX/VISTARIL) tablet 25 mg  25 mg Oral TID PRN Lindon Romp A, NP   25 mg at 06/24/18 1523  . magnesium hydroxide (MILK OF MAGNESIA) suspension 30 mL  30 mL Oral Daily PRN Lindon Romp A, NP      . mirtazapine (REMERON) tablet 15 mg  15 mg Oral QHS Cobos, Myer Peer, MD      . multivitamin with minerals tablet 1 tablet  1  tablet Oral Daily Cobos, Myer Peer, MD   1 tablet at 06/25/18 8341    Lab Results:  Results for orders placed or performed during the hospital encounter of 06/24/18 (from the past 48 hour(s))  Hemoglobin A1c     Status: Abnormal   Collection Time: 06/24/18  6:13 AM  Result Value Ref Range   Hgb A1c MFr Bld 4.5 (L) 4.8 - 5.6 %    Comment: (NOTE) Pre diabetes:          5.7%-6.4% Diabetes:              >6.4% Glycemic control for   <7.0% adults with diabetes    Mean Plasma Glucose 82.45 mg/dL    Comment: Performed at [facility], [place] [address], [place], [place] [address]  Lipid panel     Status: None   Collection Time: 06/24/18  6:13 AM  Result Value Ref Range   Cholesterol 149 0 - 200 mg/dL   Triglycerides 36 <150 mg/dL   HDL 55 >40 mg/dL   Total CHOL/HDL Ratio 2.7 RATIO   VLDL 7 0 - 40 mg/dL   LDL Cholesterol 87 0 - 99 mg/dL    Comment:        Total Cholesterol/HDL:CHD Risk Coronary Heart Disease Risk Table                     Men   Women  1/2 Average Risk   3.4   3.3  Average Risk       5.0   4.4  2 X Average Risk   9.6   7.1  3 X Average Risk  23.4   11.0        Use the calculated Patient Ratio above and the CHD Risk Table to determine the patient's CHD Risk.        ATP III CLASSIFICATION (LDL):  <100     mg/dL   Optimal  100-129  mg/dL   Near or Above                    Optimal  130-159  mg/dL   Borderline  160-189  mg/dL   High  >190     mg/dL   Very High Performed at [facility] [address] [place], [place] [address]   TSH     Status: None   Collection Time: 06/24/18  6:13 AM  Result Value Ref Range   TSH 0.648 0.350 - 4.500 uIU/mL    Comment: Performed by a 3rd Generation assay with a functional sensitivity of <=0.01 uIU/mL. Performed at [facility], [place] [address], [place], [place] [address]     Blood Alcohol level:  Lab Results  Component Value Date   ETH <10 02/77/4128    Metabolic Disorder  Labs: Lab Results  Component Value Date   HGBA1C 4.5 (L) 06/24/2018   MPG 82.45 06/24/2018   No results found for: PROLACTIN Lab Results  Component Value Date   CHOL 149 06/24/2018   TRIG 36 06/24/2018   HDL 55 06/24/2018   CHOLHDL 2.7 06/24/2018   VLDL 7 06/24/2018   LDLCALC 87 06/24/2018    Physical Findings: AIMS:  , ,  ,  ,    CIWA:    COWS:     Musculoskeletal: Strength & Muscle Tone: within normal limits Gait & Station: normal Patient leans: N/A  Psychiatric Specialty Exam: Physical Exam  ROS denies headache, no chest pain , no shortness of breath, no vomiting   Blood pressure (!) 121/91, pulse 67, temperature 98.6 F (37 C), temperature source Oral, resp. rate 16, height '5\' 5"'$  (1.651 m), weight 59 kg (129 lb 15.7 oz), last menstrual period 06/09/2018, SpO2 100 %.Body mass index is 21.63 kg/m.  General Appearance: Fairly Groomed  Eye Contact:  Good  Speech:  Normal Rate  Volume:  Normal  Mood:  Reports partial improvement but still feeling sad/depressed  Affect:  Constricted, does smile briefly at times appropriately  Thought Process:  Linear and Descriptions of Associations: Intact  Orientation:  Full (Time, Place, and Person)  Thought Content:  No hallucinations, no delusions expressed  Suicidal Thoughts:  No-denies suicidal or self-injurious ideations, tracks for safety on unit, denies homicidal or violent ideations, specifically denies any homicidal ideations towards boyfriend  Homicidal Thoughts:  No  Memory:  Recent and remote grossly intact  Judgement:  Fair  Insight:  Fair  Psychomotor Activity:  Decreased  Concentration:  Concentration: Good and Attention Span: Good  Recall:  Good  Fund of Knowledge:  Good  Language:  Good  Akathisia:  Negative  Handed:  Right  AIMS (if indicated):     Assets:  Communication Skills Desire for Improvement Resilience  ADL's:  Intact  Cognition:  WNL  Sleep:  Number of Hours: 6.75   Assessment -22 year old  female, history of depression, reports partial improvement but remains depressed/sad and endorses symptoms such as low energy, fair appetite.  Denies suicidal ideations.  Currently on Remeron which she is tolerating well thus far.  Describes brief episodes of angry outbursts during arguments with boyfriend, but is not endorsing explosiveness or anger in other contexts or with other people, and currently presents calm.  Treatment Plan Summary: Daily contact with patient to assess and evaluate symptoms and progress in treatment, Medication management, Plan Inpatient treatment and Medications as below Encourage group and milieu participation to work on coping skills and symptom reduction. Continue Remeron 15 mgrs QHS for depression Continue Vistaril 25 mgrs Q 6 hours PRN for anxiety Treatment team working on disposition [place], MD 06/25/2018, 4:10 PM

## 2018-06-25 NOTE — Plan of Care (Signed)
Patient verbalizes understanding of information, education provided. 

## 2018-06-26 NOTE — Progress Notes (Addendum)
[facility] MD Progress Note  06/26/2018 12:49 PM Haley Bentley  MRN:  [id number] Subjective: Patient endorses she is "feeling a little better today".  She presents psychologically minded and states she realizes she unconsciously "because my boyfriend through tests to make sure he loves me", and feels depressed or angry if he does not respond in the way she expected.  Attributes this to having a hypercritical, detached stepfather as she grew up. Denies suicidal ideations. Denies medication side effects  Objective: I have reviewed the chart notes and have met with patient. 22 year old female, lives with boyfriend, presented due to worsening depression and suicidal ideations. Patient endorses partially improved mood today, feels less depressed, and as above presents insightful and interested in psychodynamic motivators. Thus far tolerating Remeron trial well, denies side effects, has been sleeping better. No disruptive or agitated behaviors on unit.  She remains isolative, but brightens on approach.  Noted to be on hallway phone for periods of time.   Principal Problem: Severe recurrent major depression without psychotic features ([place]) Diagnosis:   Patient Active Problem List   Diagnosis Date Noted  . Severe recurrent major depression without psychotic features ([place]) [F33.2] 06/24/2018   Total Time spent with patient: 20 minutes  Past Psychiatric History:   Past Medical History:  Past Medical History:  Diagnosis Date  . Medical history non-contributory     Past Surgical History:  Procedure Laterality Date  . NO PAST SURGERIES     Family History: History reviewed. No pertinent family history. Family Psychiatric  History:  Social History:  Social History   Substance and Sexual Activity  Alcohol Use No     Social History   Substance and Sexual Activity  Drug Use Yes    Social History   Socioeconomic History  . Marital status: Single    Spouse name: Not on file  . Number of  children: Not on file  . Years of education: Not on file  . Highest education level: Not on file  Occupational History  . Not on file  Social Needs  . Financial resource strain: Not on file  . Food insecurity:    Worry: Not on file    Inability: Not on file  . Transportation needs:    Medical: Not on file    Non-medical: Not on file  Tobacco Use  . Smoking status: Current Some Day Smoker    Types: Cigars  . Smokeless tobacco: Never Used  Substance and Sexual Activity  . Alcohol use: No  . Drug use: Yes  . Sexual activity: Not on file  Lifestyle  . Physical activity:    Days per week: Not on file    Minutes per session: Not on file  . Stress: Not on file  Relationships  . Social connections:    Talks on phone: Not on file    Gets together: Not on file    Attends religious service: Not on file    Active member of club or organization: Not on file    Attends meetings of clubs or organizations: Not on file    Relationship status: Not on file  Other Topics Concern  . Not on file  Social History Narrative  . Not on file   Additional Social History:   Sleep: Good  Appetite:  Good  Current Medications: Current Facility-Administered Medications  Medication Dose Route Frequency Provider Last Rate Last Dose  . acetaminophen (TYLENOL) tablet 650 mg  650 mg Oral Q6H PRN Rozetta Nunnery, NP      .  alum & mag hydroxide-simeth (MAALOX/MYLANTA) 200-200-20 MG/5ML suspension 30 mL  30 mL Oral Q4H PRN Lindon Romp A, NP   30 mL at 06/24/18 1525  . hydrOXYzine (ATARAX/VISTARIL) tablet 25 mg  25 mg Oral TID PRN Lindon Romp A, NP   25 mg at 06/24/18 1523  . magnesium hydroxide (MILK OF MAGNESIA) suspension 30 mL  30 mL Oral Daily PRN Lindon Romp A, NP      . mirtazapine (REMERON) tablet 15 mg  15 mg Oral QHS Braelen Sproule, Myer Peer, MD   15 mg at 06/25/18 2115  . multivitamin with minerals tablet 1 tablet  1 tablet Oral Daily Dejohn Ibarra, Myer Peer, MD   1 tablet at 06/25/18 6812    Lab  Results:  No results found for this or any previous visit (from the past 48 hour(s)).  Blood Alcohol level:  Lab Results  Component Value Date   ETH <10 75/17/0017    Metabolic Disorder Labs: Lab Results  Component Value Date   HGBA1C 4.5 (L) 06/24/2018   MPG 82.45 06/24/2018   No results found for: PROLACTIN Lab Results  Component Value Date   CHOL 149 06/24/2018   TRIG 36 06/24/2018   HDL 55 06/24/2018   CHOLHDL 2.7 06/24/2018   VLDL 7 06/24/2018   LDLCALC 87 06/24/2018    Physical Findings: AIMS:  , ,  ,  ,    CIWA:    COWS:     Musculoskeletal: Strength & Muscle Tone: within normal limits Gait & Station: normal Patient leans: N/A  Psychiatric Specialty Exam: Physical Exam  ROS denies headache, no lightheadedness, no dizziness, no chest pain , no shortness of breath, no vomiting   Blood pressure 110/68, pulse (!) 124, temperature 98.7 F (37.1 C), temperature source Oral, resp. rate 14, height _0  (1.651 m), weight 59 kg (129 lb 15.7 oz), last menstrual period 06/09/2018, SpO2 100 %.Body mass index is 21.63 kg/m.  General Appearance: Well Groomed  Eye Contact:  Good  Speech:  Normal Rate  Volume:  Normal  Mood:  Less depressed  Affect:  Less constricted, smiles at times appropriately  Thought Process:  Linear and Descriptions of Associations: Intact  Orientation:  Full (Time, Place, and Person)  Thought Content:  No hallucinations, no delusions expressed  Suicidal Thoughts:  No-denies suicidal or self-injurious ideations, tracks for safety on unit, denies homicidal or violent ideations, specifically denies any homicidal ideations towards boyfriend  Homicidal Thoughts:  No  Memory:  Recent and remote grossly intact  Judgement:  Other:  Improving  Insight:  Improving   Psychomotor Activity:  Normal  Concentration:  Concentration: Good and Attention Span: Good  Recall:  Good  Fund of Knowledge:  Good  Language:  Good  Akathisia:  Negative  Handed:   Right  AIMS (if indicated):     Assets:  Communication Skills Desire for Improvement Resilience  ADL's:  Intact  Cognition:  WNL  Sleep:  Number of Hours: 6.75   Assessment - patient describes partially improved mood and presents with a more reactive although still vaguely constricted affect.  Denies suicidal ideations at this time. She  presents insightful and describes psychodynamic issues affecting her behaviors/relationship with her boyfriend.  Interested in psychotherapy.  Currently on Remeron which she is tolerating well thus far.  Treatment Plan Summary: Daily contact with patient to assess and evaluate symptoms and progress in treatment, Medication management, Plan Inpatient treatment and Medications as below  Treatment plan reviewed as below today June  29 Encourage group and milieu participation to work on coping skills and symptom reduction. Continue Remeron 15 mgrs QHS for depression Continue Vistaril 25 mgrs Q 6 hours PRN for anxiety Treatment team working on disposition [place], MD 06/26/2018, 12:49 PM   Patient ID: Haley Bentley, female   DOB: 1996-03-05, 22 y.o.   MRN: [id number]

## 2018-06-26 NOTE — Progress Notes (Signed)
Patient has been isolative to her room. She was observed by Clinical research associatewriter in the dayroom for a brief period of time. Writer spoke with her concerning scheduled medication. She reports that she will take it because she was informed by her doctor that it will help stimulate her appetite. She reports that she would like to discharge on medication that will help with her mood swings. Support given and safety maintained on unit with 15 min checks.

## 2018-06-26 NOTE — Progress Notes (Signed)
Adult Psychoeducational Group Note  Date:  06/26/2018 Time:  10:59 AM  Group Topic/Focus:  Goals Group:   The focus of this group is to help patients establish daily goals to achieve during treatment and discuss how the patient can incorporate goal setting into their daily lives to aide in recovery.  Participation Level:  Minimal  Participation Quality:  Appropriate, Attentive and Sharing  Affect:  Depressed and Flat  Cognitive:  Alert and Appropriate  Insight: Appropriate  Engagement in Group:  Engaged  Modes of Intervention:  Activity, Clarification, Discussion, Education and Support  Additional Comments:  Pt was encouraged to come to the goals/orientation group.  She filled out the self-inventory and her goal is to think positive.  Pt shared that she had been having more anger outbursts lately and that was not normal.  Pt revealed that she had a lot of changes in her life but did not elaborate.  Pt was acknowledged for attending the group and not isolating.  Landis MartinsGrace, Jasman Murri F  MHT/LRT/CTRS 06/26/2018, 10:59 AM

## 2018-06-26 NOTE — Progress Notes (Addendum)
D. Pt isolative to room for much of the morning- pt presents with an anxious affect but brightens and is friendly during interactions.Per pt's self inventory, pt rates her depression, hopelessness and anxiety a 2/0/4, respectively. Pt stayed on the unit for lunch, reporting that she doesn't have an appetite due to "thinking about things". Pt currently denies SI/HI and AV hallucinations A. Labs and vitals monitored. Pt brought personal pitcher of water to encourage fluids. Pt supported emotionally and encouraged to express concerns and ask questions.   R. Pt remains safe with 15 minute checks. Will continue POC.

## 2018-06-27 MED ORDER — HYDROXYZINE HCL 50 MG PO TABS
50.0000 mg | ORAL_TABLET | Freq: Three times a day (TID) | ORAL | Status: DC | PRN
  Filled 2018-06-27: qty 10

## 2018-06-27 NOTE — [facility] Group Notes (Signed)
[facility] LCSW Group Therapy Note  Date/Time:  06/27/2018 9:00-10:00 or 10:00-11:00AM  Type of Therapy and Topic:  Group Therapy:  Healthy and Unhealthy Supports  Participation Level:  Active   Description of Group:  Patients in this group were introduced to the idea of adding a variety of healthy supports to address the various needs in their lives.Patients discussed what additional healthy supports could be helpful in their recovery and wellness after discharge in order to prevent future hospitalizations.   An emphasis was placed on using counselor, doctor, therapy groups, 12-step groups, and problem-specific support groups to expand supports.  They also worked as a group on developing a specific plan for several patients to deal with unhealthy supports through boundary-setting, psychoeducation with loved ones, and even termination of relationships.   Therapeutic Goals:   1)  discuss importance of adding supports to stay well once out of the hospital  2)  compare healthy versus unhealthy supports and identify some examples of each  3)  generate ideas and descriptions of healthy supports that can be added  4)  offer mutual support about how to address unhealthy supports  5)  encourage active participation in and adherence to discharge plan    Summary of Patient Progress:  The patient stated that current healthy supports in her life include her boyfriend, her best friend, parents and her grandparents. The patient also included her therapist as a valuable source of support. Patient is able to articulate the importance of having healthy supports in their life to help in the recovery process. Patient is able to discern healthy supports from unhealthy ones.      Therapeutic Modalities:   Motivational Interviewing Brief Solution-Focused Therapy  Henrene Dodgeonnie Selah Zelman, LCSW  Evorn Gongonnie D Stephana Morell

## 2018-06-27 NOTE — [facility] Group Notes (Signed)
[facility] Group Notes:  (Nursing)  Date:  06/27/2018  Time:  1:30 PM  Type of Therapy:  Nurse Education  Participation Level:  Active  Participation Quality:  Appropriate  Affect:  Appropriate  Cognitive:  Appropriate  Insight:  Appropriate  Engagement in Group:  Engaged  Modes of Intervention:  Discussion, Education and Exploration  Summary of Progress/Problems: Group played a non competitive learning/communication board game that fosters listening skills as well as self expression  Haley NevinValerie S Eulalio Bentley 06/27/2018, 3:47 PM

## 2018-06-27 NOTE — Progress Notes (Signed)
[facility] MD Progress Note  06/27/2018 3:25 PM Haley Bentley  MRN:  [id number] Subjective: Reports feeling better than she did on admission, feels she is made significant progress, describes some ongoing anxiety.  Tends to focus on relationship issues.  Denies medication side effects.  Denies suicidal ideations.  Objective: I have reviewed the chart notes and have met with patient. 22 year old female, lives with boyfriend, presented due to worsening depression and suicidal ideations. Patient reports she is feeling better and as she improves she is focusing more on discharge soon, expresses insight regarding importance of on " working on myself" and working  on improving coping skills but at present  tends to focus primarily on relationship issues with her boyfriend.   States that she feels he is becoming more understanding now and supportive. Denies medication side effects, is tolerating Remeron well.  She is also on Vistaril PRN's for anxiety, which she feels are only partially effective. No disruptive or agitated behaviors on unit-some group participation. Denies suicidal ideations, and expresses being hopeful for discharge soon.    Principal Problem: Severe recurrent major depression without psychotic features ([place]) Diagnosis:   Patient Active Problem List   Diagnosis Date Noted  . Severe recurrent major depression without psychotic features ([place]) [F33.2] 06/24/2018   Total Time spent with patient: 20 minutes  Past Psychiatric History:   Past Medical History:  Past Medical History:  Diagnosis Date  . Medical history non-contributory     Past Surgical History:  Procedure Laterality Date  . NO PAST SURGERIES     Family History: History reviewed. No pertinent family history. Family Psychiatric  History:  Social History:  Social History   Substance and Sexual Activity  Alcohol Use No     Social History   Substance and Sexual Activity  Drug Use Yes    Social History    Socioeconomic History  . Marital status: Single    Spouse name: Not on file  . Number of children: Not on file  . Years of education: Not on file  . Highest education level: Not on file  Occupational History  . Not on file  Social Needs  . Financial resource strain: Not on file  . Food insecurity:    Worry: Not on file    Inability: Not on file  . Transportation needs:    Medical: Not on file    Non-medical: Not on file  Tobacco Use  . Smoking status: Current Some Day Smoker    Types: Cigars  . Smokeless tobacco: Never Used  Substance and Sexual Activity  . Alcohol use: No  . Drug use: Yes  . Sexual activity: Not on file  Lifestyle  . Physical activity:    Days per week: Not on file    Minutes per session: Not on file  . Stress: Not on file  Relationships  . Social connections:    Talks on phone: Not on file    Gets together: Not on file    Attends religious service: Not on file    Active member of club or organization: Not on file    Attends meetings of clubs or organizations: Not on file    Relationship status: Not on file  Other Topics Concern  . Not on file  Social History Narrative  . Not on file   Additional Social History:   Sleep: Good  Appetite:  Good  Current Medications: Current Facility-Administered Medications  Medication Dose Route Frequency Provider Last Rate Last Dose  .  acetaminophen (TYLENOL) tablet 650 mg  650 mg Oral Q6H PRN Rozetta Nunnery, NP      . alum & mag hydroxide-simeth (MAALOX/MYLANTA) 200-200-20 MG/5ML suspension 30 mL  30 mL Oral Q4H PRN Lindon Romp A, NP   30 mL at 06/24/18 1525  . hydrOXYzine (ATARAX/VISTARIL) tablet 50 mg  50 mg Oral TID PRN Cobos, Myer Peer, MD      . magnesium hydroxide (MILK OF MAGNESIA) suspension 30 mL  30 mL Oral Daily PRN Lindon Romp A, NP      . mirtazapine (REMERON) tablet 15 mg  15 mg Oral QHS Cobos, Myer Peer, MD   15 mg at 06/25/18 2115  . multivitamin with minerals tablet 1 tablet  1 tablet  Oral Daily Cobos, Myer Peer, MD   1 tablet at 06/27/18 0803    Lab Results:  No results found for this or any previous visit (from the past 48 hour(s)).  Blood Alcohol level:  Lab Results  Component Value Date   ETH <10 09/32/3557    Metabolic Disorder Labs: Lab Results  Component Value Date   HGBA1C 4.5 (L) 06/24/2018   MPG 82.45 06/24/2018   No results found for: PROLACTIN Lab Results  Component Value Date   CHOL 149 06/24/2018   TRIG 36 06/24/2018   HDL 55 06/24/2018   CHOLHDL 2.7 06/24/2018   VLDL 7 06/24/2018   LDLCALC 87 06/24/2018    Physical Findings: AIMS:  , ,  ,  ,    CIWA:    COWS:     Musculoskeletal: Strength & Muscle Tone: within normal limits Gait & Station: normal Patient leans: N/A  Psychiatric Specialty Exam: Physical Exam  ROS denies headache, no lightheadedness, no dizziness, no chest pain , no shortness of breath, no vomiting   Blood pressure (!) 115/94, pulse 85, temperature 98.7 F (37.1 C), temperature source Oral, resp. rate 20, height '5\' 5"'$  (1.651 m), weight 59 kg (129 lb 15.7 oz), last menstrual period 06/09/2018, SpO2 100 %.Body mass index is 21.63 kg/m.  General Appearance: Well Groomed  Eye Contact:  Good  Speech:  Normal Rate  Volume:  Normal  Mood:  Gradually improving mood  Affect:  Vaguely constricted, reactive, tends to improve during session  Thought Process:  Linear and Descriptions of Associations: Intact  Orientation:  Full (Time, Place, and Person)  Thought Content:  No hallucinations, no delusions expressed  Suicidal Thoughts:  No-denies suicidal or self-injurious ideations, tracks for safety on unit, denies homicidal or violent ideations, specifically denies any homicidal ideations towards boyfriend  Homicidal Thoughts:  No  Memory:  Recent and remote grossly intact  Judgement:  Other:  Improving  Insight:  Improving   Psychomotor Activity:  Normal  Concentration:  Concentration: Good and Attention Span: Good   Recall:  Good  Fund of Knowledge:  Good  Language:  Good  Akathisia:  Negative  Handed:  Right  AIMS (if indicated):     Assets:  Communication Skills Desire for Improvement Resilience  ADL's:  Intact  Cognition:  WNL  Sleep:  Number of Hours: 6.75   Assessment -patient is reporting improvement compared to how she felt at admission, continues to present vaguely anxious/constricted but affect is reactive and fuller in range.  Endorses improving mood.  Tends to focus on relationship issues, but expresses interest in individual psychotherapy after discharge.  Thus far tolerating Remeron trial well, reports Vistaril partially effective in addressing anxiety.  Treatment Plan Summary: Daily contact with patient to  assess and evaluate symptoms and progress in treatment, Medication management, Plan Inpatient treatment and Medications as below  Treatment plan reviewed as below today June 30 Encourage group and milieu participation to work on Radiographer, therapeutic and symptom reduction. Continue Remeron 15 mgrs QHS for depression Increase Vistaril to 50 mgrs Q 6 hours PRN for anxiety Treatment team working on disposition [place], MD 06/27/2018, 3:25 PM   Patient ID: Vinson Moselle, female   DOB: 1996-06-29, 22 y.o.   MRN: [id number] Patient ID: Vinson Moselle, female   DOB: 05-Dec-1996, 16 y.o.   MRN: [id number]

## 2018-06-27 NOTE — [facility] Group Notes (Signed)
Adult Psychoeducational Group Note  Date:  06/27/2018 Time:  4:35 AM  Group Topic/Focus:  Wrap-Up Group:   The focus of this group is to help patients review their daily goal of treatment and discuss progress on daily workbooks.  Participation Level:  Active  Participation Quality:  Sharing  Affect:  Appropriate  Cognitive:  Appropriate  Insight: Appropriate  Engagement in Group:  Engaged  Modes of Intervention:  Discussion  Additional Comments:  Patient attended group and engaged in a group regarding anger management plans. Patient shared that she has a therapist that she feels comfortable talking to when she is upset because they are the neutral party.   Lyndee HensenGoins, Chaka Boyson R 06/27/2018, 4:35 AM

## 2018-06-27 NOTE — Progress Notes (Signed)
Writer spoke with patient 1:1 and she reports having had a good day other than feeling a little more anxious this afternoon. Writer informed her of the increased dosage for vistaril and she was aware of this. She was observed up in the dayroom after group briefly before retuning to her room. Support given and safety maintained on unit with 15 min checks.

## 2018-06-27 NOTE — Progress Notes (Signed)
D. Pt observed initially in dayroom interacting well with peers. Pt pleasant upon approach- per pt's self inventory, pt rates her depression, hopelessness and anxiety a 0/0/1, respectively. Pt writes that her most important goal today is to work on "a positive mood/mind" and "stay interactive". Pt currently denies SI/HI and AV hallucinations A. Labs and vitals monitored. Pt supported emotionally and encouraged to express concerns and ask questions.   R. Pt remains safe with 15 minute checks. Will continue POC.

## 2018-06-28 MED ORDER — MIRTAZAPINE 15 MG PO TABS: 15.0000 mg | ORAL_TABLET | Freq: Every day | ORAL | 0 refills | Status: DC

## 2018-06-28 MED ORDER — HYDROXYZINE HCL 50 MG PO TABS: 50.0000 mg | ORAL_TABLET | Freq: Three times a day (TID) | ORAL | 0 refills | Status: DC | PRN
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
## 2018-06-28 NOTE — Progress Notes (Signed)
Patient discharged to lobby with boyfriend. Patient reviewed discharge paperwork and prescriptions with RN. All questions answered. Patient's belongings were returned from room as well as locker. Denied SI HI AVH. Patient thanked Charity fundraiserN for everything.

## 2018-06-28 NOTE — [facility] Suicide Risk Assessment (Signed)
[facility]   Principal Problem: Severe recurrent major depression without psychotic features [facility](HCC) Discharge Diagnoses:  Patient Active Problem List   Diagnosis Date Noted  . Severe recurrent major depression without psychotic features ([facility]) [F33.2] 06/24/2018    Total Time spent with patient: 30 minutes  Musculoskeletal: Strength & Muscle Tone: within normal limits Gait & Station: normal Patient leans: N/A  Psychiatric Specialty Exam: Review of Systems  All other systems reviewed and are negative.   Blood pressure 108/82, pulse (!) 111, temperature 98 F (36.7 C), resp. rate 16, height 5\' 5"  (1.651 m), weight 59 kg (129 lb 15.7 oz), last menstrual period 06/09/2018, SpO2 100 %.Body mass index is 21.63 kg/m.  General Appearance: Casual  Eye Contact::  Good  Speech:  Clear and Coherent409  Volume:  Normal  Mood:  Euthymic  Affect:  Congruent  Thought Process:  Coherent  Orientation:  Full (Time, Place, and Person)  Thought Content:  Logical  Suicidal Thoughts:  No  Homicidal Thoughts:  No  Memory:  Immediate;   Fair Recent;   Fair Remote;   Fair  Judgement:  Intact  Insight:  Fair  Psychomotor Activity:  Normal  Concentration:  Good  Recall:  Good  Fund of Knowledge:Good  Language: Good  Akathisia:  Negative  Handed:  Right  AIMS (if indicated):     Assets:  Communication Skills Desire for Improvement Housing Physical Health Resilience Social Support  Sleep:  Number of Hours: 6.75  Cognition: WNL  ADL's:  Intact   Mental Status Per Nursing Assessment::   On Admission:  Suicidal ideation indicated by patient, Self-harm thoughts, Belief that plan would result in death  Demographic Factors:  NA  Loss Factors: NA  Historical Factors: Impulsivity  Risk Reduction Factors:   Sense of responsibility to family and Living with another person, especially a relative  Continued Clinical Symptoms:  Depression:    Impulsivity  Cognitive Features That Contribute To Risk:  None    Suicide Risk:  Minimal: No identifiable suicidal ideation.  Patients presenting with no risk factors but with morbid ruminations; may be classified as minimal risk based on the severity of the depressive symptoms    Plan Of Care/Follow-up recommendations:  Activity:  ad lib  Antonieta PertGreg Lawson Reshad Saab, MD 06/28/2018, 10:26 AM

## 2018-06-28 NOTE — Plan of Care (Signed)
  Problem: Education: Goal: Emotional status will improve Outcome: Progressing Goal: Mental status will improve Outcome: Progressing   Problem: Activity: Goal: Interest or engagement in activities will improve Outcome: Progressing Goal: Sleeping patterns will improve Outcome: Progressing   Problem: Coping: Goal: Ability to verbalize frustrations and anger appropriately will improve Outcome: Progressing Goal: Ability to demonstrate self-control will improve Outcome: Progressing   Problem: Health Behavior/Discharge Planning: Goal: Identification of resources available to assist in meeting health care needs will improve Outcome: Progressing Goal: Compliance with treatment plan for underlying cause of condition will improve Outcome: Progressing   Problem: Physical Regulation: Goal: Ability to maintain clinical measurements within normal limits will improve Outcome: Progressing   Problem: Safety: Goal: Periods of time without injury will increase Outcome: Progressing   Problem: Coping: Goal: Coping ability will improve Outcome: Progressing   Problem: Medication: Goal: Compliance with prescribed medication regimen will improve Outcome: Progressing   Problem: Coping: Goal: Will verbalize feelings Outcome: Progressing   Problem: Safety: Goal: Ability to identify and utilize support systems that promote safety will improve Outcome: Progressing

## 2018-06-28 NOTE — Progress Notes (Signed)
Recreation Therapy Notes  Date: 7.1.19 Time: 0930 Location: [address] Dayroom  Group Topic: Stress Management  Goal Area(s) Addresses:  Patient will verbalize importance of using healthy stress management.  Patient will identify positive emotions associated with healthy stress management.   Behavioral Response: Engaged  Intervention: Stress Management  Activity :  Progressive Muscle Relaxation.  LRT introduced the stress management technique of progressive muscle relaxation.  LRT read script to guide patients through tensing and relaxing each muscle individually.  Patients were to follow along as LRT lead them through the exercise.  Education:  Stress Management, Discharge Planning.   Education Outcome: Acknowledges edcuation/In group clarification offered/Needs additional education  Clinical Observations/Feedback: Pt attended and participated in activity.     Zoella Roberti, LRT/CTRS         Jerry Clyne A 06/28/2018 11:40 AM 

## 2018-06-28 NOTE — Progress Notes (Signed)
Patient self inventory- Patient slept well last night, medication was not requested. Appetite has been good, energy level hight, and concentration good. Depression, hopelessness, and anxiety all rated 0 out of 10 with 10 being the worst. Denies withdrawal symptoms. Denies SI HI AVH and physical pain. Patient's goal is "positive attitude, good mood, and interaction." Patient stated she will "continue to be social and interact."  Patient compliant with all medication administration.  Safety maintained with 15 minute checks. Will continue to monitor.

## 2018-12-28 ENCOUNTER — Emergency Department (HOSPITAL_COMMUNITY)
Admission: EM | Admit: 2018-12-28 | Discharge: 2018-12-28 | Disposition: A | Discharge status: Home / Self Care | Payer: Medicaid Other | Attending: Emergency Medicine | Admitting: Emergency Medicine

## 2018-12-28 ENCOUNTER — Encounter (HOSPITAL_COMMUNITY): Payer: Self-pay

## 2018-12-28 DIAGNOSIS — Z79899 Other long term (current) drug therapy: Secondary | ICD-10-CM | POA: Insufficient documentation

## 2018-12-28 DIAGNOSIS — T50901A Poisoning by unspecified drugs, medicaments and biological substances, accidental (unintentional), initial encounter: Secondary | ICD-10-CM | POA: Insufficient documentation

## 2018-12-28 DIAGNOSIS — F411 Generalized anxiety disorder: Secondary | ICD-10-CM | POA: Insufficient documentation

## 2018-12-28 DIAGNOSIS — F17290 Nicotine dependence, other tobacco product, uncomplicated: Secondary | ICD-10-CM | POA: Insufficient documentation

## 2018-12-28 HISTORY — DX: Suicidal ideations: R45.851

## 2018-12-28 LAB — COMPREHENSIVE METABOLIC PANEL
ALT: 11 U/L (ref 0–44)
AST: 20 U/L (ref 15–41)
Albumin: 4.5 g/dL (ref 3.5–5.0)
Alkaline Phosphatase: 58 U/L (ref 38–126)
Anion gap: 10 (ref 5–15)
BUN: 12 mg/dL (ref 6–20)
CHLORIDE: 108 mmol/L (ref 98–111)
CO2: 22 mmol/L (ref 22–32)
Calcium: 8.7 mg/dL — ABNORMAL LOW (ref 8.9–10.3)
Creatinine, Ser: 0.65 mg/dL (ref 0.44–1.00)
GFR calc Af Amer: >60 mL/min (ref >60)
GFR calc non Af Amer: >60 mL/min (ref >60)
GLUCOSE: 92 mg/dL (ref 70–99)
POTASSIUM: 3.6 mmol/L (ref 3.5–5.1)
Sodium: 140 mmol/L (ref 135–145)
Total Bilirubin: 0.5 mg/dL (ref 0.3–1.2)
Total Protein: 7.7 g/dL (ref 6.5–8.1)

## 2018-12-28 LAB — I-STAT BETA HCG BLOOD, ED (MC, WL, AP ONLY): I-stat hCG, quantitative: <5 m[IU]/mL (ref <5)

## 2018-12-28 LAB — SALICYLATE LEVEL: Salicylate Lvl: <7 mg/dL (ref 2.8–30.0)

## 2018-12-28 LAB — RAPID URINE DRUG SCREEN, HOSP PERFORMED
AMPHETAMINES: NOT DETECTED
Barbiturates: NOT DETECTED
Benzodiazepines: NOT DETECTED
Cocaine: NOT DETECTED
OPIATES: NOT DETECTED
Tetrahydrocannabinol: POSITIVE — AB

## 2018-12-28 LAB — CBC
HEMATOCRIT: 37.8 % (ref 36.0–46.0)
HEMOGLOBIN: 11.8 g/dL — AB (ref 12.0–15.0)
MCH: 32.6 pg (ref 26.0–34.0)
MCHC: 31.2 g/dL (ref 30.0–36.0)
MCV: 104.4 fL — AB (ref 80.0–100.0)
Platelets: 191 10*3/uL (ref 150–400)
RBC: 3.62 MIL/uL — AB (ref 3.87–5.11)
RDW: 12.2 % (ref 11.5–15.5)
WBC: 14.2 10*3/uL — ABNORMAL HIGH (ref 4.0–10.5)
nRBC: 0 % (ref 0.0–0.2)

## 2018-12-28 LAB — ACETAMINOPHEN LEVEL: Acetaminophen (Tylenol), Serum: <10 ug/mL — ABNORMAL LOW (ref 10–30)

## 2018-12-28 LAB — GROUP A STREP BY PCR: Group A Strep by PCR: DETECTED — AB

## 2018-12-28 LAB — ETHANOL: ALCOHOL ETHYL (B): 59 mg/dL — AB (ref <10)

## 2018-12-28 NOTE — ED Triage Notes (Signed)
Pt arrived with significant other stating she took a handful of sleeping pills, unsure what kind.
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
## 2018-12-28 NOTE — ED Provider Notes (Signed)
Since  [facility]-EMERGENCY DEPT Provider Note: Lowella DellJ. Lane Dyrell Tuccillo, MD, FACEP  CSN: [id number] MRN: [id number] ARRIVAL: 12/28/18 at 0151 ROOM: RESB/RESB   CHIEF COMPLAINT  Drug Overdose   HISTORY OF PRESENT ILLNESS  12/28/18 2:53 AM Haley Bentley is a 22 y.o. female who states she took 5, 25 mg hydroxyzine tablets about an hour prior to arrival with the intention of going to sleep.  She also admits to using alcohol.  She states she usually takes 3 tablets to sleep.  She denies suicidal ideation or intent.  Her only acute complaint is sore throat with difficulty swallowing.  She states the sore throat is unrelated to the ingestion.   Past Medical History:  Diagnosis Date  . Suicidal ideation     Past Surgical History:  Procedure Laterality Date  . NO PAST SURGERIES      No family history on file.  Social History   Tobacco Use  . Smoking status: Current Some Day Smoker    Types: Cigars  . Smokeless tobacco: Never Used  Substance Use Topics  . Alcohol use: No  . Drug use: Yes    Prior to Admission medications   Medication Sig Start Date End Date Taking? Authorizing Provider  CRANBERRY FRUIT PO Take 1 capsule by mouth 2 (two) times daily.   Yes [provider]  GARLIC PO Take 1 tablet by mouth 2 (two) times daily.   Yes [provider]  hydrOXYzine (ATARAX/VISTARIL) 25 MG tablet Take 25 mg by mouth 3 (three) times daily as needed for anxiety.   Yes [provider]  hydrOXYzine (ATARAX/VISTARIL) 50 MG tablet Take 1 tablet (50 mg total) by mouth 3 (three) times daily as needed for anxiety. Patient not taking: Reported on 12/28/2018 06/28/18   Maryagnes AmosStarkes-Perry, Takia S, FNP  mirtazapine (REMERON) 15 MG tablet Take 1 tablet (15 mg total) by mouth at bedtime. Patient not taking: Reported on 12/28/2018 06/28/18   Maryagnes AmosStarkes-Perry, Takia S, FNP    Allergies Patient has no known allergies.   REVIEW OF SYSTEMS  Negative except as noted here or in the History of  Present Illness.   PHYSICAL EXAMINATION  Initial Vital Signs Blood pressure (!) 112/94, pulse (!) 107, resp. rate 19, height 5\' 5"  (1.651 m), weight 54.4 kg, last menstrual period 12/27/2018, SpO2 99 %.  Examination General: Well-developed, well-nourished female in no acute distress; appearance consistent with age of record HENT: normocephalic; atraumatic; pharyngeal erythema with exudate Eyes: pupils equal, round and reactive to light; extraocular muscles intact Neck: supple; no lymphadenopathy Heart: regular rate and rhythm Lungs: clear to auscultation bilaterally Abdomen: soft; nondistended; nontender; no masses or hepatosplenomegaly; bowel sounds present Extremities: No deformity; full range of motion; pulses normal Neurologic: Awake, alert and oriented; motor function intact in all extremities and symmetric; no facial droop Skin: Warm and dry Psychiatric: Normal mood and affect   RESULTS  Summary of this visit's results, reviewed by myself:   EKG Interpretation  Date/Time:  Tuesday December 28 2018 02:02:17 EST Ventricular Rate:  111 PR Interval:    QRS Duration: 84 QT Interval:  323 QTC Calculation: 439 R Axis:   62 Text Interpretation:  Sinus tachycardia Multiform ventricular premature complexes Probable left atrial enlargement RSR' in V1 or V2, right VCD or RVH No previous ECGs available Confirmed by Grainne Knights ([id number]) on 12/28/2018 2:22:18 AM      Laboratory Studies: Results for orders placed or performed during the hospital encounter of 12/28/18 (from the past 24 hour(s))  I-Stat beta hCG blood, ED     Status: None   Collection Time: 12/28/18  2:13 AM  Result Value Ref Range   I-stat hCG, quantitative <5.0 <5 mIU/mL   Comment 3          Comprehensive metabolic panel     Status: Abnormal   Collection Time: 12/28/18  2:14 AM  Result Value Ref Range   Sodium 140 135 - 145 mmol/L   Potassium 3.6 3.5 - 5.1 mmol/L   Chloride 108 98 - 111 mmol/L   CO2 22 22 - 32  mmol/L   Glucose, Bld 92 70 - 99 mg/dL   BUN 12 6 - 20 mg/dL   Creatinine, Ser 5.40 0.44 - 1.00 mg/dL   Calcium 8.7 (L) 8.9 - 10.3 mg/dL   Total Protein 7.7 6.5 - 8.1 g/dL   Albumin 4.5 3.5 - 5.0 g/dL   AST 20 15 - 41 U/L   ALT 11 0 - 44 U/L   Alkaline Phosphatase 58 38 - 126 U/L   Total Bilirubin 0.5 0.3 - 1.2 mg/dL   GFR calc non Af Amer >60 >60 mL/min   GFR calc Af Amer >60 >60 mL/min   Anion gap 10 5 - 15  Ethanol     Status: Abnormal   Collection Time: 12/28/18  2:14 AM  Result Value Ref Range   Alcohol, Ethyl (B) 59 (H) <10 mg/dL  cbc     Status: Abnormal   Collection Time: 12/28/18  2:14 AM  Result Value Ref Range   WBC 14.2 (H) 4.0 - 10.5 K/uL   RBC 3.62 (L) 3.87 - 5.11 MIL/uL   Hemoglobin 11.8 (L) 12.0 - 15.0 g/dL   HCT 98.1 19.1 - 47.8 %   MCV 104.4 (H) 80.0 - 100.0 fL   MCH 32.6 26.0 - 34.0 pg   MCHC 31.2 30.0 - 36.0 g/dL   RDW 29.5 62.1 - 30.8 %   Platelets 191 150 - 400 K/uL   nRBC 0.0 0.0 - 0.2 %  Rapid urine drug screen (hospital performed)     Status: Abnormal   Collection Time: 12/28/18  2:14 AM  Result Value Ref Range   Opiates NONE DETECTED NONE DETECTED   Cocaine NONE DETECTED NONE DETECTED   Benzodiazepines NONE DETECTED NONE DETECTED   Amphetamines NONE DETECTED NONE DETECTED   Tetrahydrocannabinol POSITIVE (A) NONE DETECTED   Barbiturates NONE DETECTED NONE DETECTED  Salicylate level     Status: None   Collection Time: 12/28/18  2:14 AM  Result Value Ref Range   Salicylate Lvl <7.0 2.8 - 30.0 mg/dL  Acetaminophen level     Status: Abnormal   Collection Time: 12/28/18  2:14 AM  Result Value Ref Range   Acetaminophen (Tylenol), Serum <10 (L) 10 - 30 ug/mL   Imaging Studies: No results found.  ED COURSE and MDM  Nursing notes and initial vitals signs, including pulse oximetry, reviewed.  Vitals:   12/28/18 0300 12/28/18 0400 12/28/18 0500 12/28/18 0540  BP: 121/70 118/72 108/69 116/78  Pulse: 100 (!) 126 (!) 108 (!) 111  Resp: 19 (!) 25  (!) 24 19  SpO2: 98% 98% 97% 98%  Weight:      Height:       6:07 AM TTS has assessed patient.  I do not believe the patient's overdose was with suicidal intent.  She was advised to take medications only as prescribed.  PROCEDURES    ED DIAGNOSES     ICD-10-CM  1. Accidental drug overdose, initial encounter T50.901A        Sherrod Toothman, Jonny RuizJohn, MD 12/28/18 [phone]

## 2018-12-28 NOTE — ED Notes (Signed)
Pt now stating she took 5 of her Atarax

## 2018-12-28 NOTE — ED Notes (Signed)
Spoke to poison control to inform of patients discharge.

## 2018-12-28 NOTE — ED Notes (Signed)
Pt states alcohol is on board

## 2018-12-28 NOTE — ED Notes (Signed)
Pt ambulated to restroom, will discharge when she returns

## 2019-10-25 ENCOUNTER — Other Ambulatory Visit: Payer: Self-pay

## 2019-10-25 ENCOUNTER — Encounter (HOSPITAL_COMMUNITY): Payer: Self-pay | Admitting: Emergency Medicine

## 2019-10-25 ENCOUNTER — Emergency Department (HOSPITAL_COMMUNITY)
Admission: EM | Admit: 2019-10-25 | Discharge: 2019-10-25 | Disposition: A | Discharge status: Home / Self Care | Payer: Medicaid Other | Attending: Emergency Medicine | Admitting: Emergency Medicine

## 2019-10-25 DIAGNOSIS — Z79899 Other long term (current) drug therapy: Secondary | ICD-10-CM | POA: Insufficient documentation

## 2019-10-25 DIAGNOSIS — F17290 Nicotine dependence, other tobacco product, uncomplicated: Secondary | ICD-10-CM | POA: Insufficient documentation

## 2019-10-25 DIAGNOSIS — J020 Streptococcal pharyngitis: Secondary | ICD-10-CM | POA: Insufficient documentation

## 2019-10-25 LAB — POC URINE PREG, ED: Preg Test, Ur: NEGATIVE

## 2019-10-25 LAB — GROUP A STREP BY PCR: Group A Strep by PCR: DETECTED — AB

## 2019-10-25 MED ORDER — PENICILLIN V POTASSIUM 500 MG PO TABS: 500.0000 mg | ORAL_TABLET | Freq: Two times a day (BID) | ORAL | 0 refills | Status: AC

## 2019-10-25 MED ORDER — DEXAMETHASONE 4 MG PO TABS
10.0000 mg | ORAL_TABLET | Freq: Once | ORAL | Status: AC
  Administered 2019-10-25: 10 mg via ORAL
  Filled 2019-10-25: qty 3
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
## 2019-10-25 NOTE — ED Provider Notes (Signed)
[facility] EMERGENCY DEPARTMENT Provider Note   CSN: [id number] Arrival date & time: 10/25/19  7564     History   Chief Complaint Chief Complaint  Patient presents with  . Sore Throat    HPI Haley Bentley is a 23 y.o. female.     HPI    23 year old female presents today with reports of sore throat.  She notes symptoms started yesterday with painful swallowing, no difficulty swallowing.  She denies using any medications prior.  She denies any cough or close sick contacts, denies any fever.  She does note a history of the same.  Past Medical History:  Diagnosis Date  . Suicidal ideation     Patient Active Problem List   Diagnosis Date Noted  . 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 NO PAST SURGERIES       OB History   No obstetric history on file.      Home Medications    Prior to Admission medications   Medication Sig Start Date End Date Taking? Authorizing Provider  CRANBERRY FRUIT PO Take 1 capsule by mouth 2 (two) times daily.    [provider]  GARLIC PO Take 1 tablet by mouth 2 (two) times daily.    [provider]  hydrOXYzine (ATARAX/VISTARIL) 25 MG tablet Take 25 mg by mouth 3 (three) times daily as needed for anxiety.    [provider]  penicillin v potassium (VEETID) 500 MG tablet Take 1 tablet (500 mg total) by mouth 2 (two) times daily for 10 days. 10/25/19 11/04/19  Okey Regal, PA-C    Family History No family history on file.  Social History Social History   Tobacco Use  . Smoking status: Current Some Day Smoker    Types: Cigars  . Smokeless tobacco: Never Used  Substance Use Topics  . Alcohol use: Yes    Comment: occ  . Drug use: Yes     Allergies   Patient has no known allergies.   Review of Systems Review of Systems  All other systems reviewed and are negative.    Physical  Exam Updated Vital Signs BP 120/79 (BP Location: Left Arm)   Pulse 100   Temp 98.9 F (37.2 C) (Oral)   Resp 18   Ht 5\' 5"  (1.651 m)   Wt 54.4 kg   LMP 10/05/2019   SpO2 99%   BMI 19.97 kg/m   Physical Exam Vitals signs and nursing note reviewed.  Constitutional:      Appearance: She is well-developed.  HENT:     Head: Normocephalic and atraumatic.     Comments: Bilateral tonsillar swelling with minimal erythema no exudate, no pooling of secretions, neck supple full active range of motion Eyes:     General: No scleral icterus.       Right eye: No discharge.        Left eye: No discharge.     Conjunctiva/sclera: Conjunctivae normal.     Pupils: Pupils are equal, round, and reactive to light.  Neck:     Musculoskeletal: Normal range of motion.     Vascular: No JVD.     Trachea: No tracheal deviation.  Pulmonary:     Effort: Pulmonary effort is normal.     Breath sounds: No stridor.  Neurological:     Mental Status: She is alert and oriented to person, place, and time.  Coordination: Coordination normal.  Psychiatric:        Behavior: Behavior normal.        Thought Content: Thought content normal.        Judgment: Judgment normal.      ED Treatments / Results  Labs (all labs ordered are listed, but only abnormal results are displayed) Labs Reviewed  GROUP A STREP BY PCR - Abnormal; Notable for the following components:      Result Value   Group A Strep by PCR DETECTED (*)    All other components within normal limits  POC URINE PREG, ED    EKG None  Radiology No results found.  Procedures Procedures (including critical care time)  Medications Ordered in ED Medications  dexamethasone (DECADRON) tablet 10 mg (10 mg Oral Given 10/25/19 1221)     Initial Impression / Assessment and Plan / ED Course  I have reviewed the triage vital signs and the nursing notes.  Pertinent labs & imaging results that were available during my care of the patient were  reviewed by me and considered in my medical decision making (see chart for details).        23 year old female presents today with strep pharyngitis.  No signs of complicating features.  Discharged home with penicillin, Decadron given here strict return precautions given.  She verbalized understanding and agreement to today's plan had no further questions or concerns at time of discharge.  Final Clinical Impressions(s) / ED Diagnoses   Final diagnoses:  Strep pharyngitis    ED Discharge Orders         Ordered    penicillin v potassium (VEETID) 500 MG tablet  2 times daily     10/25/19 1213           Eyvonne Mechanic, PA-C 10/26/19 1031    Sabas Sous, MD 10/26/19 1226

## 2019-10-25 NOTE — ED Notes (Signed)
Patient verbalizes understanding of discharge instructions . Opportunity for questions and answers were provided . Armband removed by staff ,Pt discharged from ED. W/C  offered at D/C  and Declined W/C at D/C and was escorted to lobby by RN.  

## 2019-10-25 NOTE — Discharge Instructions (Signed)
Please read attached information. If you experience any new or worsening signs or symptoms please return to the emergency room for evaluation. Please follow-up with your primary care provider or specialist as discussed. Please use medication prescribed only as directed and discontinue taking if you have any concerning signs or symptoms.   °

## 2020-07-24 ENCOUNTER — Other Ambulatory Visit: Payer: Self-pay

## 2020-07-24 ENCOUNTER — Emergency Department (HOSPITAL_COMMUNITY): Admission: EM | Admit: 2020-07-24 | Discharge: 2020-07-24 | Discharge status: Against Medical Advice | Payer: Self-pay

## 2020-07-25 ENCOUNTER — Emergency Department (HOSPITAL_COMMUNITY)
Admission: EM | Admit: 2020-07-25 | Discharge: 2020-07-25 | Disposition: A | Discharge status: Home / Self Care | Payer: Self-pay | Attending: Emergency Medicine | Admitting: Emergency Medicine

## 2020-07-25 ENCOUNTER — Emergency Department (HOSPITAL_COMMUNITY): Payer: Self-pay

## 2020-07-25 ENCOUNTER — Other Ambulatory Visit: Payer: Self-pay

## 2020-07-25 ENCOUNTER — Ambulatory Visit: Payer: Self-pay

## 2020-07-25 ENCOUNTER — Encounter (HOSPITAL_COMMUNITY): Payer: Self-pay

## 2020-07-25 DIAGNOSIS — F17290 Nicotine dependence, other tobacco product, uncomplicated: Secondary | ICD-10-CM | POA: Insufficient documentation

## 2020-07-25 DIAGNOSIS — M25461 Effusion, right knee: Secondary | ICD-10-CM | POA: Insufficient documentation

## 2020-07-25 DIAGNOSIS — M25561 Pain in right knee: Secondary | ICD-10-CM | POA: Insufficient documentation

## 2020-07-25 NOTE — Discharge Instructions (Signed)
Take ibuprofen 3 times a day with meals as needed for pain and swelling. You make take 3-4 pills at a time.  Do not take other anti-inflammatories at the same time (Advil, Motrin, naproxen, Aleve). You may supplement with Tylenol if you need further pain control. Use ice packs as needed for pain and swelling. Keep your leg elevated to help with swelling. Follow-up with orthopedic doctor listed below in 1 week if your pain is not improving at all. If your pain continues to improve with this treatment, continue doing so. Return to the emergency room if you develop severe worsening pain, fever, your knee becomes red/hot, you have numbness of your foot, or with any new, sudden, concerning symptoms.

## 2020-07-25 NOTE — ED Provider Notes (Signed)
[facility]-EMERGENCY DEPT Provider Note   CSN: [id number] Arrival date & time: 07/25/20  8032     History Chief Complaint  Patient presents with  . Knee Pain    Haley Bentley is a 24 y.o. female presenting for evaluation of right knee pain.  Patient states 3 days ago she tripped and fell onto her anterior right knee.  Since then, she has had pain.  She reports pain is sharp with walking, but is slowly improving.  She has been using rest, ice, and elevation over the past couple days with improvement.  She not hit her head or lose consciousness.  She denies injury or pain elsewhere.  She is here today because she still has pain and does not feel like she can go to work with this pain.  She has no other medical problems, takes no medications daily.  She denies numbness or tingling.  She does not see orthopedics. She took 1 200 mg ibuprofen without improvement.   HPI     Past Medical History:  Diagnosis Date  . Suicidal ideation     Patient Active Problem List   Diagnosis Date Noted  . 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 NO PAST SURGERIES       OB History   No obstetric history on file.     History reviewed. No pertinent family history.  Social History   Tobacco Use  . Smoking status: Current Some Day Smoker    Types: Cigars  . Smokeless tobacco: Never Used  Substance Use Topics  . Alcohol use: Yes    Comment: occ  . Drug use: Yes    Home Medications Prior to Admission medications   Medication Sig Start Date End Date Taking? Authorizing Provider  CRANBERRY FRUIT PO Take 1 capsule by mouth 2 (two) times daily.    [provider]  GARLIC PO Take 1 tablet by mouth 2 (two) times daily.    [provider]  hydrOXYzine (ATARAX/VISTARIL) 25 MG tablet Take 25 mg by mouth 3 (three) times daily as needed for anxiety.    [provider]    Allergies    Patient has no known allergies.  Review of Systems   Review of Systems  Musculoskeletal: Positive for arthralgias and joint swelling.  Neurological: Negative for numbness.    Physical Exam Updated Vital Signs BP (!) 117/87   Pulse 78   Temp 98.7 F (37.1 C)   Resp 17   LMP 07/11/2020   SpO2 100%   Physical Exam Vitals and nursing note reviewed.  Constitutional:      General: She is not in acute distress.    Appearance: She is well-developed.     Comments: Sitting in the chair no acute distress  HENT:     Head: Normocephalic and atraumatic.  Pulmonary:     Effort: Pulmonary effort is normal.  Abdominal:     General: There is no distension.  Musculoskeletal:        General: Swelling and tenderness present. Normal range of motion.     Cervical back: Normal range of motion.     Comments: Swelling of the right knee, mostly on the medial aspect.  Tenderness palpation along the medial right knee.  No erythema or warmth.  No translocation of the lateral, posterior, or anterior knee.  Good distal sensation and cap refill.  Pedal pulses 2+ bilaterally.  Full active range of motion of the knee without difficulty.  Patient is ambulatory. Soft compartments of the lower leg.   Skin:    General: Skin is warm.     Findings: No rash.  Neurological:     Mental Status: She is alert and oriented to person, place, and time.     ED Results / Procedures / Treatments   Labs (all labs ordered are listed, but only abnormal results are displayed) Labs Reviewed - No data to display  EKG None  Radiology DG Knee Complete 4 Views Right  Result Date: 07/25/2020 CLINICAL DATA:  Acute right knee pain after fall 3 days ago. EXAM: RIGHT KNEE - COMPLETE 4+ VIEW COMPARISON:  None. FINDINGS: No evidence of fracture or dislocation. Large suprapatellar joint effusion is noted. No evidence of arthropathy or other focal bone abnormality. Soft tissues are unremarkable.  IMPRESSION: Large suprapatellar joint effusion. No fracture or dislocation is noted. Electronically Signed   By: Lupita Raider M.D.   On: 07/25/2020 08:48    Procedures Procedures (including critical care time)  Medications Ordered in ED Medications - No data to display  ED Course  I have reviewed the triage vital signs and the nursing notes.  Pertinent labs & imaging results that were available during my care of the patient were reviewed by me and considered in my medical decision making (see chart for details).    MDM Rules/Calculators/A&P                          Patient presented for evaluation of right knee pain and swelling after a fall 3 days ago.  On exam, patient appears nontoxic.  She is neurovascularly intact.  Exam is not consistent with septic joint.  X-ray obtained from triage read interpreted by me, shows effusion, but no fracture dislocation.  Clinically this correlates.  Discussed findings with patient.  Discussed continued symptomatic treatment with NSAIDs, rest, ice, elevation.  Will give a knee sleeve to help with pain control.  Follow-up with orthopedics if symptoms not improving.  At this time, patient appears safe for discharge.  Return precautions given.  Patient states she understands and agrees to plan.  Final Clinical Impression(s) / ED Diagnoses Final diagnoses:  Acute pain of right knee  Effusion of right knee    Rx / DC Orders ED Discharge Orders    None       Alveria Apley, PA-C 07/25/20 7619    Benjiman Core, MD 07/25/20 1454

## 2020-07-25 NOTE — Medical Student Note (Signed)
[facility]-EMERGENCY DEPT Provider Student Note For educational purposes for Medical, PA and NP students only and not part of the legal medical record.   CSN: [id number] Arrival date & time: 07/25/20  0806      History   Chief Complaint Chief Complaint  Patient presents with  . Knee Pain    HPI Haley Bentley is a 24 y.o. female with PMH of suicidal ideation presenting with right knee pain x 3 days. She reports that 3 days ago she fell at home after she tripped, and reports landing on her right knee. She says that he pain has improved over the last 3 days and she describes the pain as sharp at times when she is moving/exerting herself. She is able to walk. 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 She took one ibuprofen over the last 3 days and reports it helped her pain. She reports swelling and pain have improved over the last few days.    HPI  Past Medical History:  Diagnosis Date  . Suicidal ideation     Patient Active Problem List   Diagnosis Date Noted  . 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 NO PAST SURGERIES      OB History   No obstetric history on file.      Home Medications    Prior to Admission medications   Medication Sig Start Date End Date Taking? Authorizing Provider  CRANBERRY FRUIT PO Take 1 capsule by mouth 2 (two) times daily.    [provider]  GARLIC PO Take 1 tablet by mouth 2 (two) times daily.    [provider]  hydrOXYzine (ATARAX/VISTARIL) 25 MG tablet Take 25 mg by mouth 3 (three) times daily as needed for anxiety.    [provider]    Family History History reviewed. No pertinent family history.  Social History Social History   Tobacco Use  . Smoking status: Current Some Day Smoker    Types: Cigars  . Smokeless tobacco: Never Used  Substance Use Topics  . Alcohol  use: Yes    Comment: occ  . Drug use: Yes     Allergies   Patient has no known allergies.   Review of Systems Review of Systems  Constitutional: Positive for fever (per pt, she didn't check temperature at home ). Negative for activity change and chills.  HENT: Negative for rhinorrhea and sore throat.   Respiratory: Negative for cough and shortness of breath.   Cardiovascular: Negative for chest pain.  Gastrointestinal: Negative for abdominal distention, abdominal pain, constipation and diarrhea.  Musculoskeletal: Positive for joint swelling (right knee). Negative for gait problem.  Skin: Negative for rash and wound.  Neurological: Negative for dizziness, syncope, weakness and light-headedness.     Physical Exam Updated Vital Signs BP (!) 117/87   Pulse 78   Temp 98.7 F (37.1 C)   Resp 17   LMP 07/11/2020   SpO2 100%   Physical Exam Constitutional:      General: She is not in acute distress.    Appearance: Normal appearance.  HENT:     Head: Normocephalic.  Eyes:     Extraocular Movements: Extraocular movements intact.  Cardiovascular:     Rate and Rhythm: Normal rate and regular rhythm.  Pulmonary:     Effort: Pulmonary effort is normal.     Breath sounds:  Normal breath sounds.  Abdominal:     General: Abdomen is flat.     Palpations: Abdomen is soft.  Musculoskeletal:        General: Normal range of motion.     Cervical back: Normal range of motion.  Skin:    General: Skin is warm and dry.     Findings: No erythema or rash.  Neurological:     Mental Status: She is alert.      ED Treatments / Results  Labs (all labs ordered are listed, but only abnormal results are displayed) Labs Reviewed - No data to display  EKG  Radiology DG Knee Complete 4 Views Right  Result Date: 07/25/2020 CLINICAL DATA:  Acute right knee pain after fall 3 days ago. EXAM: RIGHT KNEE - COMPLETE 4+ VIEW COMPARISON:  None. FINDINGS: No evidence of fracture or dislocation.  Large suprapatellar joint effusion is noted. No evidence of arthropathy or other focal bone abnormality. Soft tissues are unremarkable. IMPRESSION: Large suprapatellar joint effusion. No fracture or dislocation is noted. Electronically Signed   By: Lupita Raider M.D.   On: 07/25/2020 08:48    Procedures Procedures (including critical care time)  Medications Ordered in ED Medications - No data to display   Initial Impression / Assessment and Plan / ED Course  I have reviewed the triage vital signs and the nursing notes.  Pertinent labs & imaging results that were available during my care of the patient were reviewed by me and considered in my medical decision making (see chart for details).   Pt is a 24 yo female presenting with right knee pain x 3 days since tripping and falling on right knee at home.  DDX includes patellar fracture, septic arthritis, prepatellar bursitis, ACL/PCL tear, MCL tear, meniscus tear, muscle strain, effusion of joint. Physical exam less concerning for bursitis or septic joint given lack of systemic sx and warmth or fluctuance on exam.  XR shows no signs of fracture and is positive for effusion of knee. Will treat pt as knee effusion and discharge home with recs for OTC NSAIDs for pain management, ice, rest, elevation, and knee sleeve for compression to help with effusion. Pt educated on the plan and agreeable to plan. 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 Educated to follow up with orthopedics if sx worsen or don't improve after 1 week.  Final Clinical Impressions(s) / ED Diagnoses   Final diagnoses:  Acute pain of right knee  Effusion of right knee    New Prescriptions Discharge Medication List as of 07/25/2020  9:12 AM

## 2020-07-25 NOTE — ED Triage Notes (Signed)
Patient reports tripping and falling 3 days ago. Patient states she believes she strained her right knee.   C/O right knee pain 5/10  Patient reports pain has decreased since elevating and icing knee.

## 2021-06-11 ENCOUNTER — Emergency Department (HOSPITAL_COMMUNITY)
Admission: EM | Admit: 2021-06-11 | Discharge: 2021-06-11 | Disposition: A | Discharge status: Home / Self Care | Payer: Self-pay | Attending: Emergency Medicine | Admitting: Emergency Medicine

## 2021-06-11 ENCOUNTER — Other Ambulatory Visit: Payer: Self-pay

## 2021-06-11 ENCOUNTER — Emergency Department (HOSPITAL_COMMUNITY): Payer: Self-pay

## 2021-06-11 ENCOUNTER — Encounter (HOSPITAL_COMMUNITY): Payer: Self-pay

## 2021-06-11 DIAGNOSIS — Z5321 Procedure and treatment not carried out due to patient leaving prior to being seen by health care provider: Secondary | ICD-10-CM | POA: Insufficient documentation

## 2021-06-11 DIAGNOSIS — R0602 Shortness of breath: Secondary | ICD-10-CM | POA: Insufficient documentation

## 2021-06-11 DIAGNOSIS — R0789 Other chest pain: Secondary | ICD-10-CM | POA: Insufficient documentation

## 2021-06-11 MED ORDER — ALBUTEROL SULFATE HFA 108 (90 BASE) MCG/ACT IN AERS
2.0000 | INHALATION_SPRAY | RESPIRATORY_TRACT | Status: DC | PRN
  Administered 2021-06-11: 2 via RESPIRATORY_TRACT
  Filled 2021-06-11: qty 6.7

## 2021-06-11 NOTE — ED Triage Notes (Signed)
Patient c/o  left chest pain x 2 days. Patient also c/o SOB that occurs with the chest pain,but states SOB at other times. Patient denies diaphoresis and nausea.

## 2021-06-11 NOTE — ED Provider Notes (Signed)
[facility]-EMERGENCY DEPT Provider Note   CSN: [id number] Arrival date & time: 06/11/21  1007     History Chief Complaint  Patient presents with   Chest Pain    Haley Bentley is a 25 y.o. female.  The history is provided by the patient. No language interpreter was used.  Chest Pain  25 year old female who presents for evaluation of chest pain.  Patient reports for the past 2 to 3 days she has had recurrent pain in his chest.  Pain is described as a irritative sensation in the left chest nonradiating with some mild shortness of breath.  Pain more noticeable with breathing.  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  She also report having mold exposure to her house several weeks ago.  She denies any significant fever no runny nose, congestion, sore throat, loss of taste or smell, nausea vomiting diarrhea, exertional chest pain.  No active chest pain at this time.  No prior history of PE or DVT no recent surgery prolonged bedrest active cancer on oral birth control pill or other significant risk factor.  She denies any significant family history of cardiac disease or premature cardiac death.  Patient smoke black and mild on occasion.  Last menstrual period was 3 weeks ago.  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     Family History  Problem Relation Age of Onset   Healthy Mother    Diabetes Father     Social History   Tobacco Use   Smoking status: Some Days    Pack years: 0.00    Types: Cigars   Smokeless tobacco: Never  Vaping Use   Vaping Use: Never used  Substance Use Topics   Alcohol use: Yes     Comment: occ   Drug use: Never    Home Medications Prior to Admission medications   Medication Sig Start Date End Date Taking? Authorizing Provider  CRANBERRY FRUIT PO Take 1 capsule by mouth 2 (two) times daily.    [provider]  GARLIC PO Take 1 tablet by mouth 2 (two) times daily.    [provider]  hydrOXYzine (ATARAX/VISTARIL) 25 MG tablet Take 25 mg by mouth 3 (three) times daily as needed for anxiety.    [provider]    Allergies    Patient has no known allergies.  Review of Systems   Review of Systems  Cardiovascular:  Positive for chest pain.  All other systems reviewed and are negative.  Physical Exam Updated Vital Signs BP (!) 153/89 (BP Location: Left Arm)   Pulse 70   Temp 97.9 F (36.6 C) (Oral)   Resp 18   Ht 5\' 5"  (1.651 m)   Wt 59 kg   LMP 05/14/2021 (Approximate)   SpO2 100%   BMI 21.63 kg/m   Physical Exam Vitals and nursing note reviewed.  Constitutional:      General: She is not in acute distress.    Appearance: She is well-developed.  HENT:     Head: Atraumatic.  Eyes:     Conjunctiva/sclera: Conjunctivae normal.  Cardiovascular:     Rate and Rhythm: Normal rate and regular rhythm.  Pulmonary:     Effort: Pulmonary effort is normal.     Breath sounds: Normal breath sounds. No wheezing, rhonchi or rales.  Abdominal:     Palpations: Abdomen is soft.     Tenderness: There is no abdominal tenderness.  Musculoskeletal:        General: Normal range of motion.     Cervical back: Normal range of motion and neck supple.     Right lower leg: No edema.     Left lower leg: No edema.  Skin:    Findings: No rash.  Neurological:     Mental Status: She is alert.  Psychiatric:        Mood and Affect: Mood normal.    ED Results / Procedures / Treatments   Labs (all labs ordered are listed, but only abnormal results are displayed) Labs Reviewed - No data to display  EKG None  Date: 06/11/2021  Rate: 72   Rhythm: normal sinus rhythm  QRS Axis: normal  Intervals: normal  ST/T Wave abnormalities: normal  Conduction Disutrbances: none  Narrative Interpretation:   Old EKG Reviewed: No significant changes noted    Radiology DG Chest Portable 1 View  Result Date: 06/11/2021 CLINICAL DATA:  Chest pain. EXAM: PORTABLE CHEST 1 VIEW COMPARISON:  None. FINDINGS: The heart size and mediastinal contours are within normal limits. Both lungs are clear. No visible pleural effusions or pneumothorax. No acute osseous abnormality. IMPRESSION: No active disease. Electronically Signed   By: Feliberto Harts MD   On: 06/11/2021 11:01    Procedures Procedures   Medications Ordered in ED Medications  albuterol (VENTOLIN HFA) 108 (90 Base) MCG/ACT inhaler 2 puff (has no administration in time range)    ED Course  I have reviewed the triage vital signs and the nursing notes.  Pertinent labs & imaging results that were available during my care of the patient were reviewed by me and considered in my medical decision making (see chart for details).    MDM Rules/Calculators/A&P                          BP (!) 153/89 (BP Location: Left Arm)   Pulse 70   Temp 97.9 F (36.6 C) (Oral)   Resp 18   Ht 5\' 5"  (1.651 m)   Wt 59 kg   LMP 05/14/2021 (Approximate)   SpO2 100%   BMI 21.63 kg/m   Final Clinical Impression(s) / ED Diagnoses Final diagnoses:  Atypical chest pain    Rx / DC Orders ED Discharge Orders     None      10:49 AM Patient here with intermittent chest discomfort and some shortness of breath especially when she is exposed to smoke at her workplace and when they are burning plastic.  I suspect this is likely to be a reactive process.  No active chest pain at this time, lungs clear on auscultation patient overall well-appearing vital signs stable.  Patient is PERC negative, low suspicion for PE.  Also low suspicion for ACS.  11:19 AM EKG normal, CXR unremarkable.  Reassurance  given.  Pt stable for discharge. Will provide albuterol inhaler to use as needed.     05/16/2021, PA-C 06/20/21 1204    06/22/21, MD 06/20/21 [phone]

## 2021-06-11 NOTE — Discharge Instructions (Addendum)
Xray of your chest and your EKG are normal.  Use albuterol inhaler 2 puffs every 4 hours  as needed for shortness of breath.

## 2021-09-18 ENCOUNTER — Encounter (HOSPITAL_COMMUNITY): Payer: Self-pay

## 2021-09-18 ENCOUNTER — Other Ambulatory Visit: Payer: Self-pay

## 2021-09-18 ENCOUNTER — Emergency Department (HOSPITAL_COMMUNITY)
Admission: EM | Admit: 2021-09-18 | Discharge: 2021-09-18 | Disposition: A | Discharge status: Home / Self Care | Payer: Medicaid Other | Attending: Emergency Medicine | Admitting: Emergency Medicine

## 2021-09-18 DIAGNOSIS — Z28310 Unvaccinated for covid-19: Secondary | ICD-10-CM | POA: Insufficient documentation

## 2021-09-18 DIAGNOSIS — Z20822 Contact with and (suspected) exposure to covid-19: Secondary | ICD-10-CM | POA: Insufficient documentation

## 2021-09-18 DIAGNOSIS — L539 Erythematous condition, unspecified: Secondary | ICD-10-CM | POA: Insufficient documentation

## 2021-09-18 DIAGNOSIS — R000 Tachycardia, unspecified: Secondary | ICD-10-CM | POA: Insufficient documentation

## 2021-09-18 DIAGNOSIS — F17290 Nicotine dependence, other tobacco product, uncomplicated: Secondary | ICD-10-CM | POA: Insufficient documentation

## 2021-09-18 DIAGNOSIS — J029 Acute pharyngitis, unspecified: Secondary | ICD-10-CM | POA: Insufficient documentation

## 2021-09-18 LAB — CBC WITH DIFFERENTIAL/PLATELET
Abs Immature Granulocytes: 0.02 10*3/uL (ref 0.00–0.07)
Basophils Absolute: 0 10*3/uL (ref 0.0–0.1)
Basophils Relative: 0 %
Eosinophils Absolute: 0 10*3/uL (ref 0.0–0.5)
Eosinophils Relative: 0 %
HCT: 42.4 % (ref 36.0–46.0)
Hemoglobin: 13.3 g/dL (ref 12.0–15.0)
Immature Granulocytes: 0 %
Lymphocytes Relative: 12 %
Lymphs Abs: 1.2 10*3/uL (ref 0.7–4.0)
MCH: 32 pg (ref 26.0–34.0)
MCHC: 31.4 g/dL (ref 30.0–36.0)
MCV: 102.2 fL — ABNORMAL HIGH (ref 80.0–100.0)
Monocytes Absolute: 1 10*3/uL (ref 0.1–1.0)
Monocytes Relative: 10 %
Neutro Abs: 8.1 10*3/uL — ABNORMAL HIGH (ref 1.7–7.7)
Neutrophils Relative %: 78 %
Platelets: 153 10*3/uL (ref 150–400)
RBC: 4.15 MIL/uL (ref 3.87–5.11)
RDW: 12.1 % (ref 11.5–15.5)
WBC: 10.4 10*3/uL (ref 4.0–10.5)
nRBC: 0 % (ref 0.0–0.2)

## 2021-09-18 LAB — URINALYSIS, ROUTINE W REFLEX MICROSCOPIC
Bacteria, UA: NONE SEEN
Glucose, UA: NEGATIVE mg/dL
Ketones, ur: 40 mg/dL — AB
Leukocytes,Ua: NEGATIVE
Nitrite: NEGATIVE
Protein, ur: 100 mg/dL — AB
Specific Gravity, Urine: >1.03 — ABNORMAL HIGH (ref 1.005–1.030)
pH: 6 (ref 5.0–8.0)

## 2021-09-18 LAB — COMPREHENSIVE METABOLIC PANEL
ALT: 11 U/L (ref 0–44)
AST: 15 U/L (ref 15–41)
Albumin: 4.2 g/dL (ref 3.5–5.0)
Alkaline Phosphatase: 63 U/L (ref 38–126)
Anion gap: 7 (ref 5–15)
BUN: 11 mg/dL (ref 6–20)
CO2: 24 mmol/L (ref 22–32)
Calcium: 8.8 mg/dL — ABNORMAL LOW (ref 8.9–10.3)
Chloride: 102 mmol/L (ref 98–111)
Creatinine, Ser: 0.73 mg/dL (ref 0.44–1.00)
GFR, Estimated: >60 mL/min (ref >60)
Glucose, Bld: 108 mg/dL — ABNORMAL HIGH (ref 70–99)
Potassium: 3.7 mmol/L (ref 3.5–5.1)
Sodium: 133 mmol/L — ABNORMAL LOW (ref 135–145)
Total Bilirubin: 1 mg/dL (ref 0.3–1.2)
Total Protein: 8 g/dL (ref 6.5–8.1)

## 2021-09-18 LAB — RESP PANEL BY RT-PCR (FLU A&B, COVID) ARPGX2
Influenza A by PCR: NEGATIVE
Influenza B by PCR: NEGATIVE
SARS Coronavirus 2 by RT PCR: NEGATIVE

## 2021-09-18 LAB — LACTIC ACID, PLASMA
Lactic Acid, Venous: 1.2 mmol/L (ref 0.5–1.9)
Lactic Acid, Venous: 1.8 mmol/L (ref 0.5–1.9)

## 2021-09-18 LAB — HCG, SERUM, QUALITATIVE: Preg, Serum: NEGATIVE

## 2021-09-18 LAB — MONONUCLEOSIS SCREEN: Mono Screen: NEGATIVE

## 2021-09-18 LAB — GROUP A STREP BY PCR: Group A Strep by PCR: NOT DETECTED

## 2021-09-18 MED ORDER — DEXAMETHASONE SODIUM PHOSPHATE 10 MG/ML IJ SOLN
10.0000 mg | Freq: Once | INTRAMUSCULAR | Status: AC
  Administered 2021-09-18: 10 mg via INTRAVENOUS
  Filled 2021-09-18: qty 1

## 2021-09-18 MED ORDER — LIDOCAINE VISCOUS HCL 2 % MT SOLN
15.0000 mL | Freq: Once | OROMUCOSAL | Status: AC
  Administered 2021-09-18: 15 mL via OROMUCOSAL
  Filled 2021-09-18: qty 15

## 2021-09-18 MED ORDER — PENICILLIN G BENZATHINE 1200000 UNIT/2ML IM SUSY
1.2000 10*6.[IU] | PREFILLED_SYRINGE | Freq: Once | INTRAMUSCULAR | Status: AC
  Administered 2021-09-18: 1.2 10*6.[IU] via INTRAMUSCULAR
  Filled 2021-09-18: qty 2

## 2021-09-18 MED ORDER — SODIUM CHLORIDE 0.9 % IV BOLUS
1000.0000 mL | Freq: Once | INTRAVENOUS | Status: AC
  Administered 2021-09-18: 1000 mL via INTRAVENOUS

## 2021-09-18 MED ORDER — ACETAMINOPHEN 325 MG PO TABS
650.0000 mg | ORAL_TABLET | Freq: Once | ORAL | Status: AC
  Administered 2021-09-18: 650 mg via ORAL
  Filled 2021-09-18: qty 2

## 2021-09-18 NOTE — ED Notes (Signed)
Pt care taken, pt waiting for lab results

## 2021-09-18 NOTE — Progress Notes (Signed)
CSW met with pt regarding lack of PCP.  Pt confirmed this, also confirmed that she does not currently have health insurance.  CSW provided information on two options: Primary Care at [facility] and [facility] and discussed with pt process of setting up an initial appt.  Pt verbalized understanding. Lurline Idol, MSW, LCSW 9/21/20227:34 PM

## 2021-09-18 NOTE — Discharge Instructions (Signed)
Please continue to take Tylenol as well as ibuprofen for management of your throat pain and fevers.  I would recommend rotating these 2 medications.  Please follow the instructions on the bottles.  You can also gargle with salt water 2-3 times per day.  I would also recommend over-the-counter lozenges as well as hot tea with honey for management of your pain.  The results of your gonorrhea/chlamydia test should be available in the next 2 to 3 days.  They should reach out to you if either of these results are positive.  I would also recommend checking these results on MyChart.  If either of these results are positive you will need to return to the emergency department, urgent care, or the health department for treatment.  Please refrain from sexual intercourse until you receive these results.  If you develop any new or worsening symptoms please come back to the emergency department immediately.  It was a pleasure to meet you.

## 2021-09-18 NOTE — ED Triage Notes (Signed)
Fever, sore throat, headache, body aches, n/v/d, cough x2 weeks. Aleve and benadryl with relief.

## 2021-09-18 NOTE — ED Provider Notes (Signed)
Emergency Medicine Provider Triage Evaluation Note  Haley Bentley , a 25 y.o. female  was evaluated in triage.  Pt complains of sore throat for 2 weeks.  Symptoms have been constant.  Reports associated fevers, chills, body aches, headaches, nausea, vomiting, diarrhea, cough.  She has been taking Aleve and Benadryl with mild short-term relief.  States she has not been evaluated for symptoms as of yet.  Denies any chest pain, shortness of breath, abdominal pain, dysuria.  Physical Exam  BP (!) 134/97 (BP Location: Right Arm)   Pulse (!) 115   Temp (!) 101.5 F (38.6 C) (Oral)   Resp 20   Ht 5\' 5"  (1.651 m)   Wt 59 kg   SpO2 100%   BMI 21.64 kg/m  Gen:   Awake, no distress   Resp:  Normal effort  MSK:   Moves extremities without difficulty  Other:  Uvula midline.  Significant erythema and exudates noted in the posterior oropharynx as well as along the bilateral tonsils.  Symmetric tonsillar hypertrophy noted bilaterally.  Medical Decision Making  Medically screening exam initiated at 12:36 PM.  Appropriate orders placed.  T'Ana A Iannaccone was informed that the remainder of the evaluation will be completed by another provider, this initial triage assessment does not replace that evaluation, and the importance of remaining in the ED until their evaluation is complete.   , PA-C 09/18/21 1237    09/20/21, DO 09/19/21 [phone]

## 2021-09-18 NOTE — ED Provider Notes (Signed)
[facility]-EMERGENCY DEPT Provider Note   CSN: [id number] Arrival date & time: 09/18/21  1204     History Chief Complaint  Patient presents with   Sore Throat    Haley Bentley is a 25 y.o. female.  HPI Patient is a 25 year old female with a medical history as noted below.  She presents to the emergency department due to a sore throat.  Reports associated fevers, chills, body aches, headaches, nausea, vomiting, diarrhea, cough.  States her symptoms started about 2 weeks ago and have been waxing and waning.  She has been taking Aleve and Benadryl with mild short-term relief.  Denies any chest pain, shortness of breath, abdominal pain, or dysuria.  States she has not been vaccinated for COVID-19 and is denies any known previous COVID-19 infections.    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     Family History  Problem Relation Age of Onset   Healthy Mother    Diabetes Father     Social History   Tobacco Use   Smoking status: Some Days    Types: Cigars   Smokeless tobacco: Never  Vaping Use   Vaping Use: Never used  Substance Use Topics   Alcohol use: Yes    Comment: occ   Drug use: Never    Home Medications Prior to Admission medications   Medication Sig Start Date End Date Taking? Authorizing Provider  CRANBERRY FRUIT PO Take 1 capsule by mouth 2 (two) times daily.    [provider]  GARLIC PO Take 1 tablet by mouth 2 (two) times daily.    [provider]  hydrOXYzine (ATARAX/VISTARIL) 25 MG tablet Take 25 mg by mouth 3 (three) times daily as needed for anxiety.    [provider]    Allergies    Patient has no known  allergies.  Review of Systems   Review of Systems  All other systems reviewed and are negative. Ten systems reviewed and are negative for acute change, except as noted in the HPI.   Physical Exam Updated Vital Signs BP 118/88 (BP Location: Right Arm)   Pulse 97   Temp 98.2 F (36.8 C) (Oral)   Resp 18   Ht 5\' 5"  (1.651 m)   Wt 59 kg   SpO2 99%   BMI 21.64 kg/m   Physical Exam Vitals and nursing note reviewed.  Constitutional:      General: She is not in acute distress.    Appearance: Normal appearance. She is well-developed and normal weight. She is not ill-appearing, toxic-appearing or diaphoretic.  HENT:     Head: Normocephalic and atraumatic.     Right Ear: External ear normal.     Left Ear: External ear normal.     Nose: Nose normal.     Mouth/Throat:     Mouth: Mucous membranes are moist. No oral lesions.     Pharynx: Oropharynx is clear. Uvula midline. Posterior oropharyngeal erythema present. No pharyngeal swelling, oropharyngeal exudate or uvula swelling.     Tonsils: Tonsillar exudate present. 2+ on the right. 2+ on the left.  Eyes:     Extraocular Movements: Extraocular movements intact.  Cardiovascular:     Rate and Rhythm: Regular rhythm. Tachycardia present.     Pulses: Normal pulses.     Heart sounds: Normal heart sounds. No murmur heard.   No friction rub. No gallop.  Pulmonary:     Effort: Pulmonary effort is normal. No respiratory distress.     Breath sounds: Normal breath sounds. No stridor. No wheezing, rhonchi or rales.     Comments: LCTAB without wheezing, rales or rhonchi. Abdominal:     General: Abdomen is flat.     Palpations: Abdomen is soft.     Tenderness: There is no abdominal tenderness.     Comments: Abdomen is flat, soft, and nontender in all 4 quadrants.  Musculoskeletal:        General: Normal range of motion.     Cervical back: Normal range of motion and neck supple. No tenderness.  Skin:    General: Skin is warm and dry.   Neurological:     General: No focal deficit present.     Mental Status: She is alert and oriented to person, place, and time.  Psychiatric:        Mood and Affect: Mood normal.        Behavior: Behavior normal.   ED Results / Procedures / Treatments   Labs (all labs ordered are listed, but only abnormal results are displayed) Labs Reviewed  COMPREHENSIVE METABOLIC PANEL - Abnormal; Notable for the following components:      Result Value   Sodium 133 (*)    Glucose, Bld 108 (*)    Calcium 8.8 (*)    All other components within normal limits  CBC WITH DIFFERENTIAL/PLATELET - Abnormal; Notable for the following components:   MCV 102.2 (*)    Neutro Abs 8.1 (*)    All other components within normal limits  URINALYSIS, ROUTINE W REFLEX MICROSCOPIC - Abnormal; Notable for the following components:   Specific Gravity, Urine >1.030 (*)    Hgb urine dipstick SMALL (*)    Bilirubin Urine SMALL (*)    Ketones, ur 40 (*)    Protein, ur 100 (*)    All other components within normal limits  GROUP A STREP BY PCR  RESP PANEL BY RT-PCR (FLU A&B, COVID) [id number]  LACTIC ACID, PLASMA  LACTIC ACID, PLASMA  HCG, SERUM, QUALITATIVE  MONONUCLEOSIS SCREEN  GC/CHLAMYDIA PROBE AMP ([place]) NOT AT [facility]    EKG None  Radiology No results found.  Procedures Procedures   Medications Ordered in ED Medications  acetaminophen (TYLENOL) tablet 650 mg (650 mg Oral Given 09/18/21 1243)  sodium chloride 0.9 % bolus 1,000 mL (0 mLs Intravenous Stopped 09/18/21 1951)  dexamethasone (DECADRON) injection 10 mg (10 mg Intravenous Given 09/18/21 1700)  lidocaine (XYLOCAINE) 2 % viscous mouth solution 15 mL (15 mLs Mouth/Throat Given 09/18/21 1748)  penicillin g benzathine (BICILLIN LA) 1200000 UNIT/2ML injection 1.2 Million Units (1.2 Million Units Intramuscular Given 09/18/21 2120)    ED Course  I have reviewed the triage vital signs and the nursing notes.  Pertinent labs & imaging results that were  available during my care of the patient were reviewed by me and considered in my medical decision making (see chart for details).  Clinical Course as of 09/18/21 2122  Wed Sep 18, 2021  1827 Patient reassessed.  She notes moderate improvement in her symptoms.  She is sleeping comfortably in bed.  We discussed her lab findings.  Exam is consistent with strep pharyngitis but her strep test  is negative at this time.  She also notes that she had receptive oral intercourse with her boyfriend about 5 days ago.  We will have nursing staff obtain a GC/chlamydia of her posterior oropharynx.  We will also obtain a mononucleosis screen. [LJ]    Clinical Course User Index [LJ] Placido Sou, PA-C   MDM Rules/Calculators/A&P                          Pt is a 25 y.o. female who presents to the emergency department with fevers, chills, body aches, headaches, nausea, vomiting, diarrhea, cough, sore throat.  Symptoms started about 2 weeks ago with her sore throat starting about 2 days ago.  Labs: CBC with an MCV of 102.2 and neutrophils of 8.1. CMP with a sodium of 133, glucose 108, calcium of 8.8. UA with small hemoglobin, small bilirubin, 40 ketones, 100 protein. Lactic acid 1.8. Pregnancy test is negative. Group A strep by PCR is negative. Respiratory panel is negative. Mononucleosis screen is negative. GC/chlamydia probe of the posterior oropharynx is pending.  I, Placido Sou, PA-C, personally reviewed and evaluated these images and lab results as part of my medical decision-making.  Unsure the source of the patient's symptoms.  She presented febrile and tachycardic.  Lungs were clear to auscultation bilaterally.  Abdomen was soft and nontender in all 4 quadrants.  Lab work is mostly reassuring.  CBC without leukocytosis.  Strep test, respiratory panel, and mononucleosis screen are negative.  UA does not appear infectious.  Patient given Decadron, Tylenol, as well as IV fluids and her fever and  tachycardia have since resolved.  Vital signs are now within normal limits.  She reports significant improvement in her symptoms.  Physical exam significant for erythema and tonsillar exudates with mild symmetrical tonsillar hypertrophy.  Although her strep test is negative, given her physical exam findings being consistent with strep pharyngitis will treat with Bicillin LA.  I also obtained a gonorrhea/chlamydia swab of her posterior oropharynx.  Results are pending.  Recommended that patient check these results on MyChart.  Recommended that she refrain from intercourse until she receives results.  Patient understands she needs to return to the emergency department if either of these results are positive.  Feel that she is stable for discharge at this time and she is agreeable.  Discussed symptomatic management with OTC medications.  We discussed return precautions.  Her questions were answered and she was amicable at the time of discharge.  Note: Portions of this report may have been transcribed using voice recognition software. Every effort was made to ensure accuracy; however, inadvertent computerized transcription errors may be present.   Final Clinical Impression(s) / ED Diagnoses Final diagnoses:  Pharyngitis, unspecified etiology   Rx / DC Orders ED Discharge Orders     None        Placido Sou, PA-C 09/18/21 2125    Edwin Dada P, DO 09/19/21 0720

## 2021-09-20 LAB — GC/CHLAMYDIA PROBE AMP (~~LOC~~) NOT AT ARMC
Chlamydia: NEGATIVE
Comment: NEGATIVE
Comment: NORMAL
Neisseria Gonorrhea: NEGATIVE

## 2022-12-29 NOTE — L&D Delivery Note (Signed)
OB/GYN Faculty Practice Delivery Note  Haley Bentley is a 27 y.o. G1P0 s/p VD at [redacted]w[redacted]d. She was admitted for SOL.   ROM: 15h 10m with clear fluid GBS Status:  Negative/-- (04/29 1503) Maximum Maternal Temperature: 98.1  Labor Progress: Initial SVE: 2/90/-2. She then progressed to complete.   Delivery Date/Time: 05/17/2023 @1357  Delivery: Called to room and patient was complete and pushing. Head delivered ROA. No nuchal cord present, however, the cord was around the infant's foot. Shoulder and body delivered in usual fashion. Infant with spontaneous cry, placed on mother's abdomen, dried and stimulated. Cord clamped x 2 after 1-minute delay, and cut by FOB. Cord blood drawn. Placenta delivered spontaneously with gentle cord traction. Fundus firm with massage and Pitocin. Labia, perineum, vagina, and cervix inspected. There were no laceration to repair. There was brisk bleeding that decreased after uterine massage and TXA.   Baby Weight: pending  Placenta: 3 vessel, intact. The placenta was given to the patient per her request. Complications: None Lacerations: None EBL: 255 mL Analgesia: Epidural   Infant:  APGAR (1 MIN): 9   APGAR (5 MINS): 9    Dachelle Molzahn Autry-Lott, DO OB Fellow, Faculty [facility] [employer] 05/17/2023, 3:32 PM

## 2023-01-09 ENCOUNTER — Encounter (HOSPITAL_COMMUNITY): Payer: Self-pay | Admitting: Obstetrics and Gynecology

## 2023-01-09 ENCOUNTER — Ambulatory Visit (HOSPITAL_COMMUNITY)
Admission: EM | Admit: 2023-01-09 | Discharge: 2023-01-09 | Disposition: A | Discharge status: Home / Self Care | Payer: Medicaid Other

## 2023-01-09 ENCOUNTER — Inpatient Hospital Stay (HOSPITAL_COMMUNITY)
Admission: AD | Admit: 2023-01-09 | Discharge: 2023-01-09 | Disposition: A | Discharge status: Home / Self Care | Payer: BC Managed Care – PPO | Attending: Obstetrics and Gynecology | Admitting: Obstetrics and Gynecology

## 2023-01-09 ENCOUNTER — Encounter (HOSPITAL_COMMUNITY): Payer: Self-pay

## 2023-01-09 DIAGNOSIS — B349 Viral infection, unspecified: Secondary | ICD-10-CM

## 2023-01-09 DIAGNOSIS — J101 Influenza due to other identified influenza virus with other respiratory manifestations: Secondary | ICD-10-CM | POA: Insufficient documentation

## 2023-01-09 DIAGNOSIS — Z3A00 Weeks of gestation of pregnancy not specified: Secondary | ICD-10-CM | POA: Insufficient documentation

## 2023-01-09 DIAGNOSIS — O98519 Other viral diseases complicating pregnancy, unspecified trimester: Secondary | ICD-10-CM

## 2023-01-09 DIAGNOSIS — R112 Nausea with vomiting, unspecified: Secondary | ICD-10-CM

## 2023-01-09 DIAGNOSIS — Z1152 Encounter for screening for COVID-19: Secondary | ICD-10-CM | POA: Insufficient documentation

## 2023-01-09 DIAGNOSIS — O99519 Diseases of the respiratory system complicating pregnancy, unspecified trimester: Secondary | ICD-10-CM | POA: Diagnosis not present

## 2023-01-09 DIAGNOSIS — F17290 Nicotine dependence, other tobacco product, uncomplicated: Secondary | ICD-10-CM | POA: Insufficient documentation

## 2023-01-09 DIAGNOSIS — O99330 Smoking (tobacco) complicating pregnancy, unspecified trimester: Secondary | ICD-10-CM | POA: Insufficient documentation

## 2023-01-09 DIAGNOSIS — Z3400 Encounter for supervision of normal first pregnancy, unspecified trimester: Secondary | ICD-10-CM

## 2023-01-09 DIAGNOSIS — R000 Tachycardia, unspecified: Secondary | ICD-10-CM

## 2023-01-09 LAB — URINALYSIS, ROUTINE W REFLEX MICROSCOPIC
Bilirubin Urine: NEGATIVE
Glucose, UA: NEGATIVE mg/dL
Hgb urine dipstick: NEGATIVE
Ketones, ur: 80 mg/dL — AB
Nitrite: NEGATIVE
Protein, ur: 30 mg/dL — AB
Specific Gravity, Urine: 1.028 (ref 1.005–1.030)
pH: 6 (ref 5.0–8.0)

## 2023-01-09 LAB — RESP PANEL BY RT-PCR (RSV, FLU A&B, COVID)  RVPGX2
Influenza A by PCR: POSITIVE — AB
Influenza B by PCR: NEGATIVE
Resp Syncytial Virus by PCR: NEGATIVE
SARS Coronavirus 2 by RT PCR: NEGATIVE

## 2023-01-09 MED ORDER — FAMOTIDINE IN NACL 20-0.9 MG/50ML-% IV SOLN
20.0000 mg | Freq: Once | INTRAVENOUS | Status: AC
  Administered 2023-01-09: 20 mg via INTRAVENOUS
  Filled 2023-01-09: qty 50

## 2023-01-09 MED ORDER — ACETAMINOPHEN 500 MG PO TABS
1000.0000 mg | ORAL_TABLET | Freq: Once | ORAL | Status: AC
  Administered 2023-01-09: 1000 mg via ORAL
  Filled 2023-01-09: qty 2

## 2023-01-09 MED ORDER — PROMETHAZINE HCL 25 MG/ML IJ SOLN
25.0000 mg | Freq: Once | INTRAVENOUS | Status: AC
  Administered 2023-01-09: 25 mg via INTRAVENOUS
  Filled 2023-01-09: qty 1

## 2023-01-09 MED ORDER — LACTATED RINGERS IV SOLN
Freq: Once | INTRAVENOUS | Status: AC
  Filled 2023-01-09: qty 1000

## 2023-01-09 NOTE — MAU Provider Note (Addendum)
History     CSN: [id number]  Arrival date and time: 01/09/23 1018   Event Date/Time   First Provider Initiated Contact with Patient 01/09/23 1126      Chief Complaint  Patient presents with   Cough   Fever   HPI [name] is a 27 y.o. year old G73P0 female at Unknown weeks gestation who presents to MAU reporting being sick since Monday. Her BF has been sick also. She complains of a H/A, non-productive cough, generalized body aches, aching CP with movement, fever, vomiting (3x in 24 hrs), and diarrhea (7x in 24 hrs).   OB History     Gravida  1   Para      Term      Preterm      AB      Living         SAB      IAB      Ectopic      Multiple      Live Births              Past Medical History:  Diagnosis Date   Medical history non-contributory    Suicidal ideation     Past Surgical History:  Procedure Laterality Date   NO PAST SURGERIES      Family History  Problem Relation Age of Onset   Healthy Mother    Diabetes Father     Social History   Tobacco Use   Smoking status: Some Days    Types: Cigars   Smokeless tobacco: Never  Vaping Use   Vaping Use: Never used  Substance Use Topics   Alcohol use: Not Currently    Comment: occ   Drug use: Never    Allergies: No Known Allergies  Medications Prior to Admission  Medication Sig Dispense Refill Last Dose   Prenatal Vit-Fe Fumarate-FA (PRENATAL MULTIVITAMIN) TABS tablet Take 1 tablet by mouth daily at 12 noon.   01/09/2023   CRANBERRY FRUIT PO Take 1 capsule by mouth 2 (two) times daily.      GARLIC PO Take 1 tablet by mouth 2 (two) times daily.       Review of Systems  Constitutional:  Positive for fever.       Body aches  Eyes: Negative.   Respiratory:  Positive for cough and chest tightness (with movement).   Gastrointestinal:  Positive for diarrhea, nausea and vomiting.  Endocrine: Negative.   Genitourinary: Negative.   Musculoskeletal: Negative.   Skin: Negative.    Allergic/Immunologic: Negative.   Neurological:  Positive for headaches.  Hematological: Negative.   Psychiatric/Behavioral: Negative.     Physical Exam   Blood pressure 108/72, pulse 100, temperature 99.1 F (37.3 C), temperature source Oral, resp. rate 16, height 5\' 5"  (1.651 m), weight 64 kg, SpO2 100 %.  Physical Exam Vitals and nursing note reviewed.  Constitutional:      Appearance: Normal appearance. She is normal weight.  HENT:     Head: Normocephalic and atraumatic.  Cardiovascular:     Rate and Rhythm: Regular rhythm. Tachycardia present.  Pulmonary:     Effort: Pulmonary effort is normal.     Breath sounds: Rhonchi (LT mid lobe) present.  Abdominal:     General: Abdomen is flat. Bowel sounds are normal.     Palpations: Abdomen is soft.  Genitourinary:    Comments: deferred Musculoskeletal:        General: Normal range of motion.     Cervical  back: Normal range of motion.  Skin:    General: Skin is warm and dry.  Neurological:     General: No focal deficit present.     Mental Status: She is alert and oriented to person, place, and time. Mental status is at baseline.  Psychiatric:        Mood and Affect: Mood normal.        Behavior: Behavior normal.        Thought Content: Thought content normal.        Judgment: Judgment normal.    FHTs by doppler: 145 bpm  MAU Course  Procedures  MDM CCUA Respiratory Panel IVFs: Phenergan 25 mg in LR 1000 ml @ 999 ml/hr -- no nausea/vomiting  Tylenol 1000 mg po Pepcid 20 mg IVPB PO Challenge -- patient tolerated well    Results for orders placed or performed during the hospital encounter of 01/09/23 (from the past 24 hour(s))  Urinalysis, Routine w reflex microscopic     Status: Abnormal   Collection Time: 01/09/23 10:47 AM  Result Value Ref Range   Color, Urine AMBER (A) YELLOW   APPearance HAZY (A) CLEAR   Specific Gravity, Urine 1.028 1.005 - 1.030   pH 6.0 5.0 - 8.0   Glucose, UA NEGATIVE NEGATIVE mg/dL    Hgb urine dipstick NEGATIVE NEGATIVE   Bilirubin Urine NEGATIVE NEGATIVE   Ketones, ur 80 (A) NEGATIVE mg/dL   Protein, ur 30 (A) NEGATIVE mg/dL   Nitrite NEGATIVE NEGATIVE   Leukocytes,Ua TRACE (A) NEGATIVE   RBC / HPF 6-10 0 - 5 RBC/hpf   WBC, UA 11-20 0 - 5 WBC/hpf   Bacteria, UA FEW (A) NONE SEEN   Squamous Epithelial / HPF 11-20 0 - 5 /HPF   Mucus PRESENT   Resp panel by RT-PCR (RSV, Flu A&B, Covid) Anterior Nasal Swab     Status: Abnormal   Collection Time: 01/09/23 10:47 AM   Specimen: Anterior Nasal Swab  Result Value Ref Range   SARS Coronavirus 2 by RT PCR NEGATIVE NEGATIVE   Influenza A by PCR POSITIVE (A) NEGATIVE   Influenza B by PCR NEGATIVE NEGATIVE   Resp Syncytial Virus by PCR NEGATIVE NEGATIVE     Assessment and Plan  1. Influenza A - Past the treatment window for Tamiflu  - Advised to treat sx's - Information provided on safe meds in pg list - Advised to isolate from the public and wear a mask, if must go out   2. Normal first pregnancy with uncertain date of LMP, antepartum - Viability/Dating U/S scheduled for 02/10/2023 @ 0930 - Advised to call [facility] to schedule/establish [facility]  - Discharge patient - Patient verbalized an understanding of the plan of care and agrees.     Laury Deep, CNM 01/09/2023, 11:26 AM

## 2023-01-09 NOTE — ED Provider Notes (Signed)
[facility]    CSN: [id number] Arrival date & time: 01/09/23  1219      History   Chief Complaint Chief Complaint  Patient presents with   Fever   Generalized Body Aches    HPI Haley Bentley is a 28 y.o. female.   Patient presents with fever, generalized bodyaches, chills, headache, cough, nasal congestion, nausea, vomiting, diarrhea that started about 5 days ago.  Reports coworkers have tested positive for COVID and flu.  Patient not sure of Tmax at home.  Has taken Tylenol for symptoms but no other medications given that she is currently pregnant.  She is not sure how far along she is but reports she is due in May.  Denies blood in stool or emesis.  Patient reports that she has been having difficulty keeping food and fluids down.  Denies chest pain, shortness of breath, abdominal pain.  Denies history of asthma.     Past Medical History:  Diagnosis Date   Suicidal ideation     Patient Active Problem List   Diagnosis Date Noted   Severe recurrent major depression without psychotic features ([place]) 06/24/2018    Past Surgical History:  Procedure Laterality Date   NO PAST SURGERIES      OB History     Gravida  1   Para      Term      Preterm      AB      Living         SAB      IAB      Ectopic      Multiple      Live Births               Home Medications    Prior to Admission medications   Medication Sig Start Date End Date Taking? Authorizing Provider  Prenatal Vit-Fe Fumarate-FA (PRENATAL MULTIVITAMIN) TABS tablet Take 1 tablet by mouth daily at 12 noon.   Yes [provider]  CRANBERRY FRUIT PO Take 1 capsule by mouth 2 (two) times daily.    [provider]  GARLIC PO Take 1 tablet by mouth 2 (two) times daily.    [provider]    Family History Family History  Problem Relation Age of Onset   Healthy Mother    Diabetes Father     Social History Social History   Tobacco Use   Smoking  status: Some Days    Types: Cigars   Smokeless tobacco: Never  Vaping Use   Vaping Use: Never used  Substance Use Topics   Alcohol use: Yes    Comment: occ   Drug use: Never     Allergies   Patient has no known allergies.   Review of Systems Review of Systems Per HPI  Physical Exam Triage Vital Signs ED Triage Vitals  Enc Vitals Group     BP      Pulse      Resp      Temp      Temp src      SpO2      Weight      Height      Head Circumference      Peak Flow      Pain Score      Pain Loc      Pain Edu?      Excl. in [place]?    No data found.  Updated Vital Signs BP 110/78 (BP Location:  Left Arm)   Pulse (!) 108   Temp 98.8 F (37.1 C) (Oral)   Resp 18   SpO2 97%   Visual Acuity Right Eye Distance:   Left Eye Distance:   Bilateral Distance:    Right Eye Near:   Left Eye Near:    Bilateral Near:     Physical Exam Constitutional:      General: She is not in acute distress.    Appearance: Normal appearance. She is not toxic-appearing or diaphoretic.  HENT:     Head: Normocephalic and atraumatic.     Right Ear: Tympanic membrane and ear canal normal.     Left Ear: Tympanic membrane and ear canal normal.     Nose: Congestion present.     Mouth/Throat:     Mouth: Mucous membranes are moist.     Pharynx: No pharyngeal swelling or posterior oropharyngeal erythema.  Eyes:     Extraocular Movements: Extraocular movements intact.     Conjunctiva/sclera: Conjunctivae normal.     Pupils: Pupils are equal, round, and reactive to light.  Cardiovascular:     Rate and Rhythm: Regular rhythm. Tachycardia present.     Pulses: Normal pulses.     Heart sounds: Normal heart sounds.  Pulmonary:     Effort: Pulmonary effort is normal. No respiratory distress.     Breath sounds: Normal breath sounds. No stridor. No wheezing, rhonchi or rales.  Abdominal:     General: Abdomen is flat. Bowel sounds are normal.     Palpations: Abdomen is soft.  Musculoskeletal:         General: Normal range of motion.     Cervical back: Normal range of motion.  Skin:    General: Skin is warm and dry.  Neurological:     General: No focal deficit present.     Mental Status: She is alert and oriented to person, place, and time. Mental status is at baseline.  Psychiatric:        Mood and Affect: Mood normal.        Behavior: Behavior normal.        Thought Content: Thought content normal.        Judgment: Judgment normal.      UC Treatments / Results  Labs (all labs ordered are listed, but only abnormal results are displayed) Labs Reviewed - No data to display  EKG   Radiology No results found.  Procedures Procedures (including critical care time)  Medications Ordered in UC Medications - No data to display  Initial Impression / Assessment and Plan / UC Course  I have reviewed the triage vital signs and the nursing notes.  Pertinent labs & imaging results that were available during my care of the patient were reviewed by me and considered in my medical decision making (see chart for details).     Patient most likely has some type of viral upper respiratory infection.  Although, I am concerned given patient is tachycardic from low 100s to 118 on initial triage and physical exam.  Given that she is also pregnant, this is very concerning as she may be dehydrated and needs further evaluation and management.  I do think patient needs a higher level of care at the maternity assessment unit given that she is currently pregnant.  Advised patient to go to the ER for further evaluation and management and she was agreeable with plan.  Patient stable at discharge.  Agree with patient self transport to the MAU. Final Clinical  Impressions(s) / UC Diagnoses   Final diagnoses:  Tachycardia  Viral illness  Nausea vomiting and diarrhea     Discharge Instructions      Go to the maternity assessment unit as soon as you leave urgent care for further evaluation and  management.  This is located across the street at [facility].     ED Prescriptions   None    PDMP not reviewed this encounter.   Teodora Medici, [place] 01/09/23 [phone]

## 2023-01-09 NOTE — ED Triage Notes (Signed)
Pt c/o fever, body aches, chills, head/back pain since Monday. States having diarrhea and no appetite now. States she is pregnant but unsure how far, states due in May. States taking tylenol with no relief.

## 2023-01-09 NOTE — MAU Note (Signed)
.  Haley Bentley is a 27 y.o. at Unknown here in MAU reporting: She is unsure how far along she is but believes she is around 4 months. She has been going to an OB off of [address], but did not receive any EDD. Her last appointment was in November 2023 and her next appointment is 01/20/22.   She has been having HA, non-productive cough, aching chest pain (7/10) with movement, generalized body aches (10/10), fever, vomiting (3x in last 24 hours), diarrhea (7x in the last 24 hours) since Monday. Denies VB or LOF. Has not begun to feel FM yet. She and her boyfriend have both been sick since Monday.  LMP: May of 2023 Onset of complaint: Started on Monday Pain score: 10/10 Vitals:   01/09/23 1037  BP: 113/81  Pulse: (!) 108  Resp: 16  Temp: 99.1 F (37.3 C)  SpO2: 100%     FHT:145 Lab orders placed from triage:  UA, preg, respiratory swab

## 2023-01-09 NOTE — Discharge Instructions (Signed)

## 2023-01-09 NOTE — Discharge Instructions (Signed)
Go to the maternity assessment unit as soon as you leave urgent care for further evaluation and management.  This is located across the street at [facility].

## 2023-01-29 IMAGING — DX DG CHEST 1V PORT
1 series · 1 of 1 positions shown · non-contrast
Comparison: None.

CLINICAL DATA: Chest pain.

EXAM:
PORTABLE CHEST 1 VIEW

[chest ap]
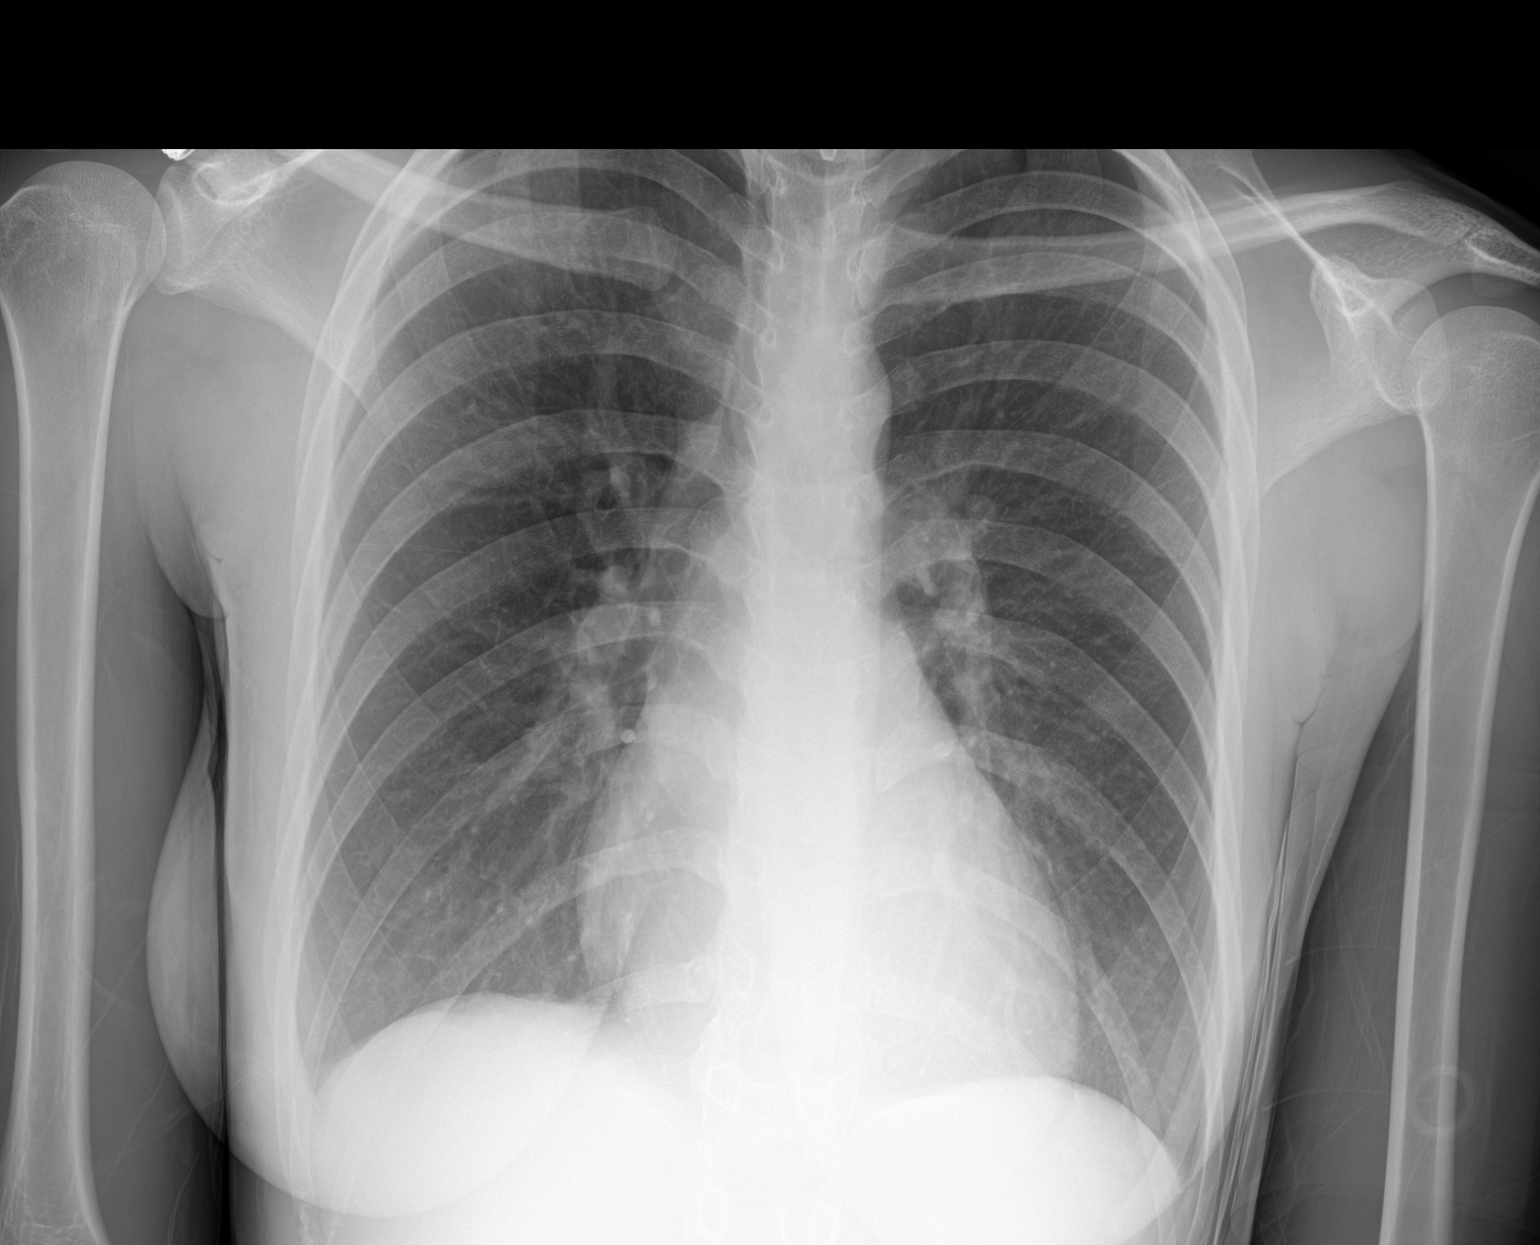

[1 of 1 positions shown; findings below may reference images not displayed]

FINDINGS: The heart size and mediastinal contours are within normal limits.
Both lungs are clear. No visible pleural effusions or pneumothorax.
No acute osseous abnormality.
IMPRESSION: No active disease.

## 2023-02-10 ENCOUNTER — Other Ambulatory Visit: Payer: Self-pay | Admitting: General Practice

## 2023-02-10 ENCOUNTER — Other Ambulatory Visit: Payer: Medicaid Other

## 2023-02-10 ENCOUNTER — Ambulatory Visit: Payer: Medicaid Other

## 2023-02-10 DIAGNOSIS — O3680X0 Pregnancy with inconclusive fetal viability, not applicable or unspecified: Secondary | ICD-10-CM

## 2023-03-17 ENCOUNTER — Ambulatory Visit: Payer: BC Managed Care – PPO | Admitting: Advanced Practice Midwife

## 2023-03-17 ENCOUNTER — Other Ambulatory Visit: Payer: Self-pay | Admitting: Obstetrics and Gynecology

## 2023-03-17 ENCOUNTER — Ambulatory Visit (INDEPENDENT_AMBULATORY_CARE_PROVIDER_SITE_OTHER): Payer: BC Managed Care – PPO

## 2023-03-17 ENCOUNTER — Telehealth: Payer: Self-pay

## 2023-03-17 DIAGNOSIS — Z3A29 29 weeks gestation of pregnancy: Secondary | ICD-10-CM

## 2023-03-17 DIAGNOSIS — O3680X0 Pregnancy with inconclusive fetal viability, not applicable or unspecified: Secondary | ICD-10-CM

## 2023-03-17 DIAGNOSIS — Z3483 Encounter for supervision of other normal pregnancy, third trimester: Secondary | ICD-10-CM | POA: Diagnosis not present

## 2023-03-17 DIAGNOSIS — O0933 Supervision of pregnancy with insufficient antenatal care, third trimester: Secondary | ICD-10-CM

## 2023-03-17 NOTE — Telephone Encounter (Signed)
Radene Gunning, MD  P [facility] Admin Pool; P [facility] Clinical Pool This patient needs prenatal care asap and intake done. She needs anatomy US with mfm asap.  Thanks, pad  Left message that I am calling with appts if she could return the call to the office.   Anatomy U/S scheduled for Thursday, March 21st at 1230.  Message routed to front office to contact pt for prenatal appointment.    Frances Nickels  03/17/23

## 2023-03-19 ENCOUNTER — Ambulatory Visit: Payer: BC Managed Care – PPO | Admitting: *Deleted

## 2023-03-19 ENCOUNTER — Encounter: Payer: Self-pay | Admitting: *Deleted

## 2023-03-19 ENCOUNTER — Ambulatory Visit: Payer: BC Managed Care – PPO | Attending: Obstetrics and Gynecology

## 2023-03-19 VITALS — BP 112/65 | HR 87

## 2023-03-19 DIAGNOSIS — O0933 Supervision of pregnancy with insufficient antenatal care, third trimester: Secondary | ICD-10-CM | POA: Insufficient documentation

## 2023-03-19 DIAGNOSIS — Z3689 Encounter for other specified antenatal screening: Secondary | ICD-10-CM

## 2023-03-30 NOTE — Telephone Encounter (Signed)
Pt received U/S on 03/19/23.  Pt schedule for NEW OB on 04/06/23.    Haley Bentley

## 2023-04-06 ENCOUNTER — Encounter: Payer: BC Managed Care – PPO | Admitting: Obstetrics and Gynecology

## 2023-04-27 ENCOUNTER — Other Ambulatory Visit (HOSPITAL_COMMUNITY)
Admission: RE | Admit: 2023-04-27 | Discharge: 2023-04-27 | Disposition: A | Discharge status: Home / Self Care | Payer: BC Managed Care – PPO | Source: Ambulatory Visit | Attending: Obstetrics and Gynecology | Admitting: Obstetrics and Gynecology

## 2023-04-27 ENCOUNTER — Encounter: Payer: Self-pay | Admitting: Medical

## 2023-04-27 ENCOUNTER — Other Ambulatory Visit: Payer: Self-pay

## 2023-04-27 ENCOUNTER — Ambulatory Visit (INDEPENDENT_AMBULATORY_CARE_PROVIDER_SITE_OTHER): Payer: BC Managed Care – PPO | Admitting: Medical

## 2023-04-27 VITALS — BP 123/81 | HR 102 | Wt 160.8 lb

## 2023-04-27 DIAGNOSIS — O0990 Supervision of high risk pregnancy, unspecified, unspecified trimester: Secondary | ICD-10-CM | POA: Insufficient documentation

## 2023-04-27 DIAGNOSIS — O0933 Supervision of pregnancy with insufficient antenatal care, third trimester: Secondary | ICD-10-CM | POA: Diagnosis not present

## 2023-04-27 DIAGNOSIS — G4730 Sleep apnea, unspecified: Secondary | ICD-10-CM | POA: Insufficient documentation

## 2023-04-27 DIAGNOSIS — Z3A36 36 weeks gestation of pregnancy: Secondary | ICD-10-CM

## 2023-04-27 DIAGNOSIS — O0993 Supervision of high risk pregnancy, unspecified, third trimester: Secondary | ICD-10-CM

## 2023-04-27 NOTE — Progress Notes (Signed)
   PRENATAL VISIT NOTE  Subjective:  Haley Bentley is a 27 y.o. G1P0 at [redacted]w[redacted]d being seen today for her first prenatal visit for this pregnancy.  She is currently monitored for the following issues for this low-risk pregnancy and has Severe recurrent major depression without psychotic features (HCC); Supervision of high risk pregnancy, antepartum; Late prenatal care affecting pregnancy in third trimester; and Sleep apnea on their problem list.  Patient reports  pelvic pressure, occasional contractions .  Contractions: Not present. Vag. Bleeding: None.  Movement: Present. Denies leaking of fluid.   She is planning to breastfeed. Unsure about contraception.   The following portions of the patient's history were reviewed and updated as appropriate: allergies, current medications, past family history, past medical history, past social history, past surgical history and problem list.   Objective:   Vitals:   04/27/23 1427  BP: 123/81  Pulse: (!) 102  Weight: 160 lb 12.8 oz (72.9 kg)    Fetal Status: Fetal Heart Rate (bpm): 126 Fundal Height: 34 cm Movement: Present  Presentation: Vertex  General:  Alert, oriented and cooperative. Patient is in no acute distress.  Skin: Skin is warm and dry. No rash noted.   Cardiovascular: Normal heart rate and rhythm noted  Respiratory: Normal respiratory effort, no problems with respiration noted. Clear to auscultation.   Abdomen: Soft, gravid, appropriate for gestational age. Normal bowel sounds. Non-tender. Pain/Pressure: Present     Pelvic: Cervical exam performed with chaperone Dilation: Closed Effacement (%): 20 Station: -3  Extremities: Normal range of motion.  Edema: None  Mental Status: Normal mood and affect. Normal behavior. Normal judgment and thought content.    Assessment and Plan:  Pregnancy: G1P0 at [redacted]w[redacted]d 1. Supervision of high risk pregnancy, antepartum - GC/Chlamydia probe amp ([place])not at [facility] - Culture, beta strep (group b  only) - Culture, OB Urine - CBC/D/Plt+RPR+Rh+ABO+RubIgG... - Hemoglobin A1c - Panorama Prenatal Test Full Panel - [place] Basic Panel - Glucose tolerance, 1 hour - Tdap vaccine greater than or equal to 7yo IM - CBC - [place] MFM OB FOLLOW UP; Future  2. Late prenatal care affecting pregnancy in third trimester - First OB visit today at 1 weeks  - [place] MFM OB FOLLOW UP; Future  3. [redacted] weeks gestation of pregnancy   Preterm labor/first trimester warning symptoms and general obstetric precautions including but not limited to vaginal bleeding, contractions, leaking of fluid and fetal movement were reviewed in detail with the patient. Please refer to After Visit Summary for other counseling recommendations.   Discussed the normal visit cadence for prenatal care Discussed the nature of our practice with multiple providers including residents and students   Return in about 1 week (around 05/04/2023) for LOB, In-Person, any provider.  No future appointments.  Vonzella Nipple, PA-C

## 2023-04-27 NOTE — Progress Notes (Signed)
Pt reports pain in lower abdomen, works 12 hr shifts

## 2023-04-27 NOTE — Patient Instructions (Signed)
Safe Medications in Pregnancy   Acne:  Benzoyl Peroxide  Salicylic Acid   Backache/Headache:  Tylenol: 2 regular strength every 4 hours OR               2 Extra strength every 6 hours   Colds/Coughs/Allergies:  Benadryl (alcohol free) 25 mg every 6 hours as needed  Breath right strips  Claritin  Cepacol throat lozenges  Chloraseptic throat spray  Cold-Eeze- up to three times per day  Cough drops, alcohol free  Flonase (by prescription only)  Guaifenesin  Mucinex  Robitussin DM (plain only, alcohol free)  Saline nasal spray/drops  Sudafed (pseudoephedrine) & Actifed * use only after [redacted] weeks gestation and if you do not have high blood pressure  Tylenol  Vicks Vaporub  Zinc lozenges  Zyrtec   Constipation:  Colace  Ducolax suppositories  Fleet enema  Glycerin suppositories  Metamucil  Milk of magnesia  Miralax  Senokot  Smooth move tea   Diarrhea:  Kaopectate  Imodium A-D   *NO pepto Bismol   Hemorrhoids:  Anusol  Anusol HC  Preparation H  Tucks   Indigestion:  Tums  Maalox  Mylanta  Zantac  Pepcid   Insomnia:  Benadryl (alcohol free) 25mg every 6 hours as needed  Tylenol PM  Unisom, no Gelcaps   Leg Cramps:  Tums  MagGel   Nausea/Vomiting:  Bonine  Dramamine  Emetrol  Ginger extract  Sea bands  Meclizine  Nausea medication to take during pregnancy:  Unisom (doxylamine succinate 25 mg tablets) Take one tablet daily at bedtime. If symptoms are not adequately controlled, the dose can be increased to a maximum recommended dose of two tablets daily (1/2 tablet in the morning, 1/2 tablet mid-afternoon and one at bedtime).  Vitamin B6 100mg tablets. Take one tablet twice a day (up to 200 mg per day).   Skin Rashes:  Aveeno products  Benadryl cream or 25mg every 6 hours as needed  Calamine Lotion  1% cortisone cream   Yeast infection:  Gyne-lotrimin 7  Monistat 7    **If taking multiple medications, please check labels to avoid  duplicating the same active ingredients  **take medication as directed on the label  ** Do not exceed 4000 mg of tylenol in 24 hours  **Do not take medications that contain aspirin or ibuprofen            [place] Pediatric Providers  [place] ([address]) [facility] Brown, MD; Chambliss, MD; Eniola, MD; Hensel, MD; McDiarmid, MD; McIntyer, MD [address] [place], [place] [address] [phone] Mon-Fri 8:30-12:30, 1:30-5:00  Providers come to see babies during newborn hospitalization Only accepting infants of Mother's who are seen at Family Medicine Center or have siblings seen at   Family [facility] Center Medicaid - Yes; Tricare - Yes   [facility] Mulberry, MD [address] [place], [place] [address] [phone] Mon, Tue, Thur, Fri 8:30-5:00, Wed 10:00-7:00 (closed 1-2pm daily for lunch) Takes [place] residents with no insurance.  [place] [facility] only with Medicaid/insurance; Tricare - no  [facility] Ben-Davies, MD; Brown, MD; Chandler, MD; Ettefagh, MD; Grant, MD; Hanvey, MD; Herrin, MD; Dara,  MD; Lester, MD; McCormick, MD; McQueen, MD; Simha, MD; Stanley, MD; Stryffeler, NP [address], [place],  [address] [phone] Mon, Tue, Thur, Fri 8:30-5:30, Wed 9:30-5:30, Sat 8:30-12:30 Only accepting infants of first-time parents or siblings of current   patients Hospital discharge coordinator will make follow-up appointment Medicaid - yes; Tricare - yes  [place] ([address]) [facility] Cox, MD; Davis, MD; Dovico, MD; Ettefaugh, MD; Lowe, MD; Nation, MD; Slimp, MD; Sumner, MD; Williams, MD [address], [place], [place] [address] [phone] Mon-Fri 8:30-5:00, closed for lunch 12:30-1:30; Sat-Sun 10:00-1:00 Accepting Newborns with commercial insurance only, must call prior to  delivery to be accepted into  practice.  Medicaid - no, Tricare - yes   [facility] [address] [place], [place] [address] [phone] or [phone] Mon to Fri 8am to 10pm, Sat 8am to 1pm (virtual only on weekends) Only accepts Medicaid Healthy Blue pts  [facility]  Artis, MD; Coccaro, MD; [place] Gardner, MD; Netherton, NP; Roper, MD; Wilmot, PA-C; Skinner, MD [address] [place], [place] [address] [phone] Mon-Fri 8:30-5:30 Medicaid - yes, Tricare - yes  [place] ([address]) [facility] Warner, MD [address], [place], [place] [address] [phone] Mon, Tues, Wed Fri 8:30-5:00, Sat 8:30-12:00, Closed Thursdays Accepting siblings of established patients and first time mom's if you call prenatally Medicaid- yes; Tricare - yes  [facility] Becker, PA; Hagler, MD; Quinn, PA-C; Scifres, PA; Sun, MD; Swayne, MD;  [address], [place], [place] [address] [phone] Mon-Fri 8:30-5:00, closed for lunch 1-2 Only accepting newborns of established patients Medicaid- no; Tricare - yes  [place] ([address]) [facility] Timberlake, MD; [address], [place], [place] [address] [phone] Mon-Fri 8:00-5:00 Medicaid - No; Tricare - Yes  [facility] [employer]  Brake, NP; Wharton, PA [address] [place] [address], [place], [place] [address] [phone] Mon-Fri 8:00-5:00 Medicaid - No, Tricare - Yes  [facility] Gay, MD; Quinlan, MD; Blatt, DNP [address] [place], [place] [address] [phone]  Mon-Fri 8:00-5:00 Medicaid - No; Tricare - Yes  [facility] [address] [place], [place] [address] [phone] Mon-Fri 8:30-5:00 (lunch 12:00-1:00) Medicaid -Yes; Tricare - Yes  [facility] Jordan, MD [address],  [place], [place] [address] [phone] Mon-Fri 8:00-5:00 Seeing newborns of current patients only. No new patients Medicaid - No, Tricare - yes  [facility] Parker, MD [address] [place], [place] [address] [phone] Mon-Fri 8:00-5:00 Medicaid -yes as secondary coverage only; Tricare - yes  [facility] Brecken, PA; Christy, NP; Dees, MD; DeClaire, MD; DeWeese, MD; Hodge, PA; Smoot, NP; Summer, MD; Vapne, MD [address] [place], [place] [address] [phone] Mon-Fri 8:30-5:00, Sat 9:00-11:00 Accepts commercial insurance ONLY. Offers free prenatal information sessions for families. Medicaid - No, Tricare - Call first  [facility] Bouska, MD; Gordon, PA; Jeffery, PA; Weber, PA [address] [place] [address] [place] [address] [phone] Mon-Fri 7:30-5:30 Medicaid - Yes; Tricare - yes  [place] ([address] & [address])  [facility] Reese, MD [address] [place], [place] [address] [phone] Mon-Thur 8:00-6:00, closed for lunch 12-2, closed Fridays Medicaid - yes; Tricare - no  [facility] Anderson, NP; Badger, MD; Beal, PA; Spencer, PA [address], [place],  [address] [phone] Mon-Fri 7:30-4:30 Medicaid - yes, Tricare - yes  [facility]  Agbuya, MD; Klett, NP; Romgoolam, MD; Rothstein, NP [address], [place],  [address] [phone] Mon-Fri 8:30-5:00, closed for lunch 1-2, Sat 8:30-12:00 - sick visits only Providers come to see   babies at [facility] Only accepting newborns of siblings and first time parents ONLY if who have met with office prior to delivery Medicaid -Yes; Tricare - yes  [facility]  Golden, DO; Friddle, NP; Wallace, MD; Wood, MD:  [address], [place], [place] [address] [phone] Mon- Fri 8:00-5:00, Sat 9:00-12:00 - sick  visits only Accepting siblings of established patients and first time mom/baby Medicaid - Yes; Tricare - yes Patients must have vaccinations (baby vaccines)  [place] ([address] & [address])  [facility] [place] [facility] [address] [place], [place] [address] [phone] Mon-Fri 8:00-5:00 Medicaid - no; Tricare - yes  [facility] Briscoe, MD; Schmidt, PA; Moreira, PA [address], [place], [place] [address] [phone] Mon-Fri 8:00-5:00 Medicaid- yes; Tricare - yes  [facility] Boyd, MD; Kauer, NP; Osborn, PA [address], [place], [place] [address] [phone] Mon-Fri 8:00-5:00 Medicaid - Yes; Tricare - yes  [place] ([address])  [facility] Atkinson, PA; Calderon, PA; Cummings, MD; Dillard, MD; Henrish, NP; Isenhour, DO; Martin, PA; Olson, MD; Ott, MD; Phillips, MD; Valente, PA; VanDeven, PA; Yonjof, NP [address], [place], [place] [address] [phone] Mon-Fri 8:30-5:00, Sat 9:00-12:00 - sick only Please register online [email or web address] then schedule online or call office Medicaid-Yes; Tricare -yes  [facility]  Dabrusco, MD; Dial, MD; Savanna, MD; Fleenor, NP; Goolsby, PA; Tonuzi, MD; Turner, NP; West, MD [address], [place], [place] [address] [phone] Mon-Fri 8:00-5:30, Sat&Sun by appointment (phones open at 8:30) Medicaid - Yes; Tricare - yes  [place] ([address]) [facility] Allen, CPNP; Bates, MD; Gordon, MD; Mills, NP; Weinshilboum, DO [address], [place], [place] [address] [phone] M-F 8:00 - 5:15, Sat/Sun 9-12 sick visits only Medicaid - No; Tricare - yes  [facility] [place] [facility]  Brown, PA-C; Cowen, PA-C; Dennis, DO; Fuster, PA-C; Martin, PA-C; Shelton,  PA-C; Spry, MD [address] [place] [address] [place], [place] [address] [phone] Mon-Thur 8:00-7:00, Fri 8:00-5:00 Accepting Medicaid for 13 and under only   [facility] [place] [facility] [place] Hayes, FNP; List, FNP; Moran, MD; Pitonzo, PA-C; Scholer, MD; Spangle, FNP; Nzenwa, FNP; Jasper, MD; Moran, MD [address] [place], [place] [address] [phone] Mon-Fri 8:30-5:30 Medicaid - Yes; Tricare - yes  [facility] [address]  Kelly, CPNP; Logan, MD; Poth, MD; Ramadoss, MD; Staton, NP [address], [place], [place] [address] [phone] Mon-Thur 8:00-5:30, Fri 8:00-5:00, Sat 9:00-12:00 Medicaid - yes, Tricare - yes  [place] ([address])  [facility] [place] Masneri, DO; Meyers, MD; Nelson, PA [address], [place], [place] [address] [phone] Mon-Fri 8:00-5:00, closed for lunch 12-1 Medicaid - No; Tricare - yes  [facility] [place] McGowen, MD [address], [place], [place] [address] [phone] Mon-Fri 8:00-5:00 Medicaid - No; Tricare - yes  [facility] [place] MacDonald, MD; Nayak, MD; Kearns, MD; Verrastro, MD [address] [place] [address], [place], [place] [address] [phone] Mon-Fri 8:00-5:00 Medicaid- Yes; Tricare - yes  [place] ([address])  [facility] [place] Martin, PA-C; Tabori, MD [address], [place], [place] [address] [phone] Mon-Fri 8:00-5:00 Medicaid - No; Tricare - yes  [facility] [place]  Margin - CPNP [address], [place], [place] [address] [phone] Mon-Weds 8:00-6:00, Thurs-Fri 8:00-5:00, Sat 9:00-12:00 Medicaid - yes; Tricare - yes   [facility] [place] Aubuchon, MD; Brandon, PA [address] [place], [place] [address] [phone] Mon-Fri 8:00-5:00 Medicaid - yes; Tricare - yes  [place]  Pediatric Providers  [facility] [address], [place], [place] [address] [phone] M, Thur: 8am -8pm, Tues, Weds: 8am - 5pm; Fri: 8-1 Medicaid - Yes; Tricare - yes  [facility] Mertz, MD; Johnson, MD; Wells, MD; Downs, PA; Hockenberger, PA [address], [place], [place] [address] [phone] M-F 8:30 - 5:00 Medicaid - Call office; Tricare -yes  [facility] Bonney, MD; Page, MD, Minter, MD; Mueller, PNP; Thomason, NP [address], [place], [place] [address] [phone] M-F 8:30 - 5:00, Sat/Sun 8:30 - 12:30 (sick visits) Medicaid - Call office; Tricare -yes  [facility] Lewis, MD; Shaub, PNP; Boylston, MD; Quaile, PA; Nonato, NP; Landon, CPNP [address], [place], [place] [address] [phone] M-F 8:30 - 5:00 Medicaid - Call office; Tricare - yes  [facility] [place] Cline, MD; Dvergsten, MD; Flores, MD; Kawatu, MD; Nogo, MD [address], [place], [place] [address] [phone] M-Thur: 8:00 - 5:00; Fri: 8:00 - 4:00 Medicaid - yes; Tricare - yes  [facility] [address] [place], [place], [place] [address] [phone] M-F: 8:30- 5:00, closed for lunch 12:30 - 1:00 Medicaid - yes; Tricare -yes  [facility] [place] [address], [place], [place] [address] [phone] M-F 8:00 - 5:00 Medicaid - yes; Tricare - yes  [facility] Johnson, DO; Rumball, DO; Wicker, NP [address] [place], [place], [place] [address] [phone] M-F 8:00 - 5:00, Closed 12-1 for lunch Medicaid - Call; Tricare - yes  [facility] Stein, MD [address], [place], [place] [address] [phone] M-F: 8:00-5:00, Sat: 8:00 - noon Medicaid - call; Tricare -yes  [place] Pediatric Providers  [facility] Collins, FNP-C [address], [place], [place] [address] [phone] M-W: 8:00-5:00, Thur: 8:00 -  7:00, Fri: 8:00 - noon Medicaid - yes; Tricare - yes  [facility] [place] Adams, FNP [address], [place], [place] [address] [phone] M-F 8:00 - 5:00, Closed for lunch 12-1 Medicaid - yes; Tricare - yes  [place] Pediatric Providers  [facility] Smith, FNP, Melvin, MD, Fay, FNP-C [address] [place] [address] [place], [place], [place] [address] [phone] M-T 8:00-5:00, Wed-Fri 7:00-6:00 Medicaid - Yes; Tricare -yes  [facility] [place] Civiletti, DO; [address], [place], [place] [address] [phone] M-F 8:00 - 5:00, closed for lunch 12-1 Medicaid - Yes; Tricare - yes  [facility]  Barnes, MD; Bergdolt, MD; Caulfield, MD; Emrich, MD; Fiscus, MD; Hoppens, MD; Kylstra, MD, McPherson, MD; Todd, MD; Prestwood, MD; Waters, MD; Wood, MD [address], [place], [place] [address] [phone] M-F 8:00-5:00 Medicaid - yes; Tricare - yes  [facility] Cheema, MD (speaks Punjabi and Hindi) [address] [place],  [address] [phone] M-F: 8:30 - 5:00, closed 12:30 - 1 for lunch Medicaid - Yes; Tricare -yes  [place] Pediatric Providers  [facility] Loda, MD; Timberlake, MD; Burke,   MD [address], [place], [place] [address] [phone] M-Th: 8:00 - 5:30, Fri: 8:00 - 12:00 Medicaid - yes; Tricare - yes  [facility] [place] Lookabill, NP; Meier, MD; Daffron, MD [address], [place], [place] [address] [phone] M-F: 8:00 - 5:00 Medicaid - yes; Tricare - yes  [facility] Busse, NP; Bowman, NP; Baune, NP; Entwistle, MD; Williams, MD, Huffman, NP, Ferguson, MD; Patel, DO [address], [place], [place] [address] [phone] M-F: 8:30 - 5:30p Medicaid - yes; Tricare - yes Other locations available as well  [place] [facility] Rajan, MD; Wilson, MD; Morgan, PA-C, Domenech, PA-C; Myers, PA-C [address], [place], [place] [address] [phone] M-W: 8:00am - 7:00pm, Thurs: 8:00am - 8:00pm; Fri: 8:00am - 5:00pm, closed daily from 12-1 for lunch Medicaid - yes; Tricare - yes  [place] Pediatric Providers  [facility] [place] Adams, MD; Crystal, FNP; Hadley, MD; Stokes, MD; Johnson, PNP; Brady, PA-C; West, PNP; Gardner, MD;  [address], [place], [place] [address] [phone] M - Fri: 8am - 5pm, Sat 9-noon Medicaid - Yes; Tricare -yes  [facility] [place] Nayak, MD; Rost, FNP; McDonald, MD; Kearns, MD [address] [place] [address], [place], [place] [phone] M-F 8:00 - 5:00 Medicaid - call; Tricare - yes  [facility] [place] Bell, MD; Emory, PNP; Pinder, MD; Anderson, MD; Light, PA-C; Johnson, MD; Latta, MD; Saul, PNP; Rainey, MD; Clifford, MD; McClung, MD [address], [place], [place] [address] [phone] M-F 8:00am - 5:00pm; Sat. 9:00 - 11:00 Medicaid - yes; Tricare - yes  [facility] [place] Soldato-Couture, MD [address], [place], [place] [address] [phone] M-F 8:00 - 5:00 Medicaid - [place] Medicaid only; Tricare - yes  [facility] [place] Walker, MD; Davis, PNP; Ajizian, MD [address] [place] [address], [place], [place] [address] [phone] M-F 8:00 - 5:00 Medicaid - yes; Tricare - yes  [facility] [place] Barry, MD; Brown, MD, Forest, MD, Hazek, MD; Hoyle, MD; Smith, MD; [address], [place], [place] [address] [phone] M-F: 8-5 Medicaid - yes; Tricare - yes  [facility] [place] Brady, Md; Dowlen, MD; [address] [place] [address], [place], [place] [address] [phone] M-F 8-5 Medicaid - yes; Tricare - yes  [facility] Kearns, MD; Nayak, MD; Soldato-Courture, MD; Pellam-Palmer, DNP;  Herring, PNP [address], , [place] [address] [phone] M-F 8-5 Medicaid - yes; Tricare - yes  [facility] Weathers, MD; Merritt, PA-C; Davis-PA-C; Warnimont, MD [address], [place], [place] [address] [phone] M-F 7am - 5 pm Medicaid - call; Tricare - yes  [facility] Hill, PNP; Erickson, MD; Robinson, MD [address], [place], [place] [address] [phone] M-F 8-5 Medicaid - yes; Tricare - yes  [facility] Kribbs, MD; Warner, MD; Williams, FNP; Brooks, FNP; Boles, FNP; Romblad, PA-C; [place] - FNP [address], [place], [place] [address] [phone] M-F 8-5 Medicaid- yes; Tricare - yes  [facility] [place] [place] Yoder, MD; Verenes, MD; Armentrout, MD; Stewart, MD; Beasley, CPNP; Ford, MD; Erickson, MD; Rice, MD [address], [place], [place] [address] [phone] M-F: 8-5, Sat: 9-4, Sun 9-12 Medicaid - yes; Tricare - yes  [facility] Little, PNP; Davis, PNP [address], [place],   [place] [address] [phone] M-F 8 - 5, closed 12-1 for lunch Medicaid - yes; Tricare - yes  [facility] Friesen, MD; Cnegia, MD; Rice, MD; Patel, DO [address], [place], [place] [address] [phone] M-F 8- 5:30 Medicaid - yes; Tricare - yes  [facility] [place] Zvolensky, MD; Ray, MD; Haas, MD [address] [address], [place], [place] [address] [phone] M: 8-7; Tues-Fri: 8-5; Sat: 9-12 Medicaid - yes; Tricare - yes  [facility] [place] Heinrich, MD; Meyer, MD; Clark, MD; Rhyne, MD; Aubuchon, MD [address], [place], [place] [address] [phone] M: 8-7; Tues-Fri: 8-5; Sat: 8:30-12:30 Medicaid - yes;  Tricare - yes  [facility] [place] Bista, MD; Dillard, PA [address], [place], [place] [address] [phone] Mon-Fri: 8-5 Medicaid - yes; Tricare - yes  [facility] [place] Beasley, CPNP; Mahle, CPNP; Rice, MD; Duffy, MD; Culler, MD; [address], [place], [place] [address] [phone] M-F: 8-5, closed 1-2 for lunch Medicaid - yes; Tricare - yes  [facility] Rickman, PA; Mounce, NP; Smith, MD; Jordan, CPNP; Darty, PA; Ball, MD; Wallace, MD [address] [place] [address], [place], [place] [address] [phone] M-Thurs: 8-7; Fri: 8-6; Sat: 9-12; Sun 2-4 Medicaid - yes; Tricare - yes  [facility] Brown, MD; Shin, MD; Goodman, DNP, FNP; Sebesta, DO; [address], [place], [place] [address] [phone] M-F: 8-5 Medicaid - yes; Tricare - yes  [place] Pediatric Providers  [facility] Dough, MD; Welsh, NP [address], [place], [place] [address] [phone] M - Fri: 8am - 5pm, closed for lunch 12-1 Medicaid - Yes; Tricare - yes  [facility] Manandhar, MD; Riley, MD; Sanger, DO; Vinocur, MD;Hall, PA; Walsh, PA; Campbell, NP [address], [place] [address] [phone] M-F 8:00 - 5:00, Sat 8:00 - 11:30 Medicaid - yes; Tricare - yes  [facility] Khan, MD; Redding, MD, Street, MD, Holt, MD, Burgart, MD; Rhyne, NP; Dickinson, PA;  [address] [place] [address], [place], [place] [address] [phone] M-F 8:10am - 5:00pm Medicaid - yes; Tricare - yes  [facility] Connors, MD; Kime, NP [address], [place], [place] [address] [phone] M-F 8:00 - 5:00 Medicaid - [place] Medicaid only; Tricare - yes  [facility] [place] Whyte, MD; Fox, NP [address], [place], [place] [address] [phone] M-F 8:00 - 5:00; Closed for lunch 12 - 1:00 Medicaid - yes; Tricare - yes  [facility] Penner, MD; [place], FNP [address], [place], [place] [address] [phone] Mon 9-5; Tues/Wed 10-5; Thurs 8:30-5; Fri: 8-12:30 Medicaid - yes; Tricare - yes  [place] Pediatric Providers  [facility]  Golding, MD; Jackson, PA-C [address], [place], [place] [address] [phone] phone [phone] fax M-F 7:15 - 4:30 Medicaid - yes; Tricare - yes  [facility] Gosrani, MD; Meccariello, DO [address] [place], [place] [address] [phone] M-Fri: 8:30 - 5:00, closed for lunch everyday noon - 1pm Medicaid - Yes; Tricare - yes  [facility] Burdine, MD; Daniel, MD; Howard, MD; Sasser, MD; Boles, PA; Boyd, PA-C; Carroll, PA; McGee, PA; Skillman,   PA; Wilson, PA [address] [place], [place] [address] [phone] M-Thurs: 7:30am - 7:00pm; Friday 7:30am - 4pm; Sat: 8:00 - 1:00 Medicaid - Yes; Tricare - yes  [facility] [place] Akhbari, MD; Law, MD; Qayumi, MD; Salvador, DO [address], [place], [place] [address] [phone] M-Thur: 8:00 - 5:00, Fri: 8:00 - Noon Medicaid - yes; Tricare - yes No [place] [employer]  [facility] [place] [facility] Dettinger, MD; Gottschalk, DO; Hawks, NP; Martin, NP; Morgan, NP; Milian, NP; Rakes, NP; Stacks, MD; Webster, PA [address], [place], [place] [address] [phone] M-F 8:00 - 5:00 Medicaid - yes; Tricare - yes  [facility] Collins, FNP-C; Bucio, FNP-C [address], [place], [place] [address] [phone] M, W, R 8:00-5:00, Tues: 8:00am - 7:00pm; Fri 8:00 - noon Medicaid - Yes; Tricare - yes  [facility] Khan, MD [address] [place]  [address] [place], [place] [address] [phone]  M-Thurs 8:30-5:30, Fri: 8:30-12:30pm Medicaid - Yes; Tricare - N  

## 2023-04-28 LAB — CBC/D/PLT+RPR+RH+ABO+RUBIGG...
Antibody Screen: NEGATIVE
Basophils Absolute: 0 10*3/uL (ref 0.0–0.2)
Basos: 0 %
EOS (ABSOLUTE): 0.1 10*3/uL (ref 0.0–0.4)
Eos: 1 %
HCV Ab: NONREACTIVE
HIV Screen 4th Generation wRfx: NONREACTIVE
Hematocrit: 35.1 % (ref 34.0–46.6)
Hemoglobin: 11.4 g/dL (ref 11.1–15.9)
Hepatitis B Surface Ag: NEGATIVE
Immature Grans (Abs): 0.1 10*3/uL (ref 0.0–0.1)
Immature Granulocytes: 1 %
Lymphocytes Absolute: 2 10*3/uL (ref 0.7–3.1)
Lymphs: 21 %
MCH: 31.9 pg (ref 26.6–33.0)
MCHC: 32.5 g/dL (ref 31.5–35.7)
MCV: 98 fL — ABNORMAL HIGH (ref 79–97)
Monocytes Absolute: 0.8 10*3/uL (ref 0.1–0.9)
Monocytes: 9 %
Neutrophils Absolute: 6.6 10*3/uL (ref 1.4–7.0)
Neutrophils: 68 %
Platelets: 233 10*3/uL (ref 150–450)
RBC: 3.57 x10E6/uL — ABNORMAL LOW (ref 3.77–5.28)
RDW: 11.9 % (ref 11.7–15.4)
RPR Ser Ql: NONREACTIVE
Rh Factor: NEGATIVE
Rubella Antibodies, IGG: 1.06 index (ref >0.99)
WBC: 9.6 10*3/uL (ref 3.4–10.8)

## 2023-04-28 LAB — GLUCOSE TOLERANCE, 1 HOUR: Glucose, 1Hr PP: 94 mg/dL (ref 70–199)

## 2023-04-28 LAB — HEMOGLOBIN A1C
Est. average glucose Bld gHb Est-mCnc: 94 mg/dL
Hgb A1c MFr Bld: 4.9 % (ref 4.8–5.6)

## 2023-04-28 LAB — GC/CHLAMYDIA PROBE AMP (~~LOC~~) NOT AT ARMC
Chlamydia: NEGATIVE
Comment: NEGATIVE
Comment: NORMAL
Neisseria Gonorrhea: NEGATIVE

## 2023-04-28 LAB — HCV INTERPRETATION

## 2023-04-29 LAB — CULTURE, OB URINE

## 2023-04-29 LAB — URINE CULTURE, OB REFLEX

## 2023-05-01 LAB — CULTURE, BETA STREP (GROUP B ONLY): Strep Gp B Culture: NEGATIVE

## 2023-05-04 LAB — HORIZON CUSTOM: REPORT SUMMARY: NEGATIVE

## 2023-05-06 LAB — PANORAMA PRENATAL TEST FULL PANEL:PANORAMA TEST PLUS 5 ADDITIONAL MICRODELETIONS: FETAL FRACTION: 27.9

## 2023-05-07 ENCOUNTER — Other Ambulatory Visit: Payer: BC Managed Care – PPO

## 2023-05-07 ENCOUNTER — Other Ambulatory Visit: Payer: Self-pay

## 2023-05-07 ENCOUNTER — Ambulatory Visit (INDEPENDENT_AMBULATORY_CARE_PROVIDER_SITE_OTHER): Payer: BC Managed Care – PPO | Admitting: Obstetrics and Gynecology

## 2023-05-07 VITALS — BP 123/81 | HR 92 | Wt 163.8 lb

## 2023-05-07 DIAGNOSIS — Z3483 Encounter for supervision of other normal pregnancy, third trimester: Secondary | ICD-10-CM

## 2023-05-07 DIAGNOSIS — Z3A38 38 weeks gestation of pregnancy: Secondary | ICD-10-CM

## 2023-05-07 NOTE — Progress Notes (Signed)
   PRENATAL VISIT NOTE  Subjective:  Haley Bentley is a 27 y.o. G1P0 at [redacted]w[redacted]d being seen today for ongoing prenatal care.  She is currently monitored for the following issues for this low-risk pregnancy and has Severe recurrent major depression without psychotic features (HCC); Supervision of high risk pregnancy, antepartum; Late prenatal care affecting pregnancy in third trimester; and Sleep apnea on their problem list.  Patient reports no complaints.  Contractions: Not present. Vag. Bleeding: None.  Movement: Present. Denies leaking of fluid.   The following portions of the patient's history were reviewed and updated as appropriate: allergies, current medications, past family history, past medical history, past social history, past surgical history and problem list.   Objective:   Vitals:   05/07/23 1444  BP: 123/81  Pulse: 92  Weight: 163 lb 12.8 oz (74.3 kg)    Fetal Status: Fetal Heart Rate (bpm): 131 Fundal Height: 36 cm Movement: Present  Presentation: Vertex  General:  Alert, oriented and cooperative. Patient is in no acute distress.  Skin: Skin is warm and dry. No rash noted.   Cardiovascular: Normal heart rate noted  Respiratory: Normal respiratory effort, no problems with respiration noted  Abdomen: Soft, gravid, appropriate for gestational age.  Pain/Pressure: Present     Pelvic: Cervical exam deferred        Extremities: Normal range of motion.  Edema: None  Mental Status: Normal mood and affect. Normal behavior. Normal judgment and thought content.   Assessment and Plan:  Pregnancy: G1P0 at [redacted]w[redacted]d 1. [redacted] weeks gestation of pregnancy New ob labs wnl, anatomy u/s wnl. GBS neg. Unsure about BC.  D/w her re: 41wk delivery for post dates and can set this up next visit, which she is amenable to.   Term labor symptoms and general obstetric precautions including but not limited to vaginal bleeding, contractions, leaking of fluid and fetal movement were reviewed in detail with  the patient. Please refer to After Visit Summary for other counseling recommendations.   Return in about 1 week (around 05/14/2023) for low risk ob, in person, md or app.  Future Appointments  Date Time Provider Department Center  05/14/2023  2:55 PM Sue Lush, FNP [facility]  05/20/2023  2:15 PM Hermina Staggers, MD [facility]  05/20/2023  3:15 PM [facility]    [place] Bing, MD

## 2023-05-13 ENCOUNTER — Ambulatory Visit (INDEPENDENT_AMBULATORY_CARE_PROVIDER_SITE_OTHER): Payer: BC Managed Care – PPO | Admitting: Family Medicine

## 2023-05-13 ENCOUNTER — Other Ambulatory Visit: Payer: Self-pay

## 2023-05-13 ENCOUNTER — Encounter: Payer: Self-pay | Admitting: Family Medicine

## 2023-05-13 VITALS — BP 120/82 | HR 90 | Wt 170.6 lb

## 2023-05-13 DIAGNOSIS — O0933 Supervision of pregnancy with insufficient antenatal care, third trimester: Secondary | ICD-10-CM

## 2023-05-13 DIAGNOSIS — O0990 Supervision of high risk pregnancy, unspecified, unspecified trimester: Secondary | ICD-10-CM

## 2023-05-13 DIAGNOSIS — O0993 Supervision of high risk pregnancy, unspecified, third trimester: Secondary | ICD-10-CM | POA: Diagnosis not present

## 2023-05-13 DIAGNOSIS — Z0289 Encounter for other administrative examinations: Secondary | ICD-10-CM

## 2023-05-13 DIAGNOSIS — Z3A39 39 weeks gestation of pregnancy: Secondary | ICD-10-CM | POA: Diagnosis not present

## 2023-05-13 NOTE — Progress Notes (Signed)
   PRENATAL VISIT NOTE  Subjective:  Haley Bentley is a 27 y.o. G1P0 at [redacted]w[redacted]d being seen today for ongoing prenatal care.  She is currently monitored for the following issues for this high-risk pregnancy and has Supervision of high risk pregnancy, antepartum; Late prenatal care affecting pregnancy in third trimester; and Sleep apnea on their problem list.  Patient reports no complaints.  Contractions: Irritability. Vag. Bleeding: None.  Movement: Present. Denies leaking of fluid.   The following portions of the patient's history were reviewed and updated as appropriate: allergies, current medications, past family history, past medical history, past social history, past surgical history and problem list.   Objective:   Vitals:   05/13/23 1008  BP: 120/82  Pulse: 90  Weight: 170 lb 9.6 oz (77.4 kg)    Fetal Status: Fetal Heart Rate (bpm): 135   Movement: Present     General:  Alert, oriented and cooperative. Patient is in no acute distress.  Skin: Skin is warm and dry. No rash noted.   Cardiovascular: Normal heart rate noted  Respiratory: Normal respiratory effort, no problems with respiration noted  Abdomen: Soft, gravid, appropriate for gestational age.  Pain/Pressure: Absent     Pelvic: Cervical exam deferred        Extremities: Normal range of motion.  Edema: None  Mental Status: Normal mood and affect. Normal behavior. Normal judgment and thought content.   Assessment and Plan:  Pregnancy: G1P0 at [redacted]w[redacted]d 1. Supervision of high risk pregnancy, antepartum FH appropriate VIgorous movement CBE   Term labor symptoms and general obstetric precautions including but not limited to vaginal bleeding, contractions, leaking of fluid and fetal movement were reviewed in detail with the patient. Please refer to After Visit Summary for other counseling recommendations.   Return in about 1 week (around 05/20/2023) for Routine prenatal care.  Future Appointments  Date Time Provider  Department Center  05/20/2023  2:15 PM Hermina Staggers, MD [facility]  05/20/2023  3:15 PM [facility]    Federico Flake, MD

## 2023-05-13 NOTE — Patient Instructions (Addendum)
[email or web address]    [facility]  Tuesdays & Thursdays 9:30am-3:30pm [address], [place], [place] Located in the [employer] Building ([facility]) Room 110A  For more information: Call: [phone] or Email: [email or web address] To book a FREE appt: [email or web address]  Offering prenatal consults,  postpartum outpatient lactation consults,  pumping consults and  "Each One, Teach One" informal lactation education for support persons.

## 2023-05-14 ENCOUNTER — Encounter: Payer: BC Managed Care – PPO | Admitting: Obstetrics and Gynecology

## 2023-05-16 ENCOUNTER — Encounter (HOSPITAL_COMMUNITY): Payer: Self-pay | Admitting: Obstetrics and Gynecology

## 2023-05-16 ENCOUNTER — Other Ambulatory Visit: Payer: Self-pay

## 2023-05-16 ENCOUNTER — Inpatient Hospital Stay (HOSPITAL_COMMUNITY)
Admission: AD | Admit: 2023-05-16 | Discharge: 2023-05-19 | DRG: 807 | Disposition: A | Discharge status: Home / Self Care | Payer: BC Managed Care – PPO | Attending: Obstetrics and Gynecology | Admitting: Obstetrics and Gynecology

## 2023-05-16 DIAGNOSIS — O26893 Other specified pregnancy related conditions, third trimester: Secondary | ICD-10-CM | POA: Diagnosis present

## 2023-05-16 DIAGNOSIS — O0990 Supervision of high risk pregnancy, unspecified, unspecified trimester: Secondary | ICD-10-CM

## 2023-05-16 DIAGNOSIS — Z23 Encounter for immunization: Secondary | ICD-10-CM | POA: Diagnosis not present

## 2023-05-16 DIAGNOSIS — Z3A39 39 weeks gestation of pregnancy: Secondary | ICD-10-CM

## 2023-05-16 DIAGNOSIS — Z87891 Personal history of nicotine dependence: Secondary | ICD-10-CM

## 2023-05-16 DIAGNOSIS — O4290 Premature rupture of membranes, unspecified as to length of time between rupture and onset of labor, unspecified weeks of gestation: Secondary | ICD-10-CM | POA: Diagnosis present

## 2023-05-16 LAB — POCT FERN TEST: POCT Fern Test: POSITIVE

## 2023-05-16 MED ORDER — LACTATED RINGERS IV SOLN: INTRAVENOUS | Status: DC

## 2023-05-16 NOTE — H&P (Signed)
OBSTETRIC ADMISSION HISTORY AND PHYSICAL  Haley Bentley is a 27 y.o. female G1P0 with IUP at [redacted]w[redacted]d by ultrasound at 29 weeks presenting for rupture of membranes. She reports +FMs, No LOF, no VB, no blurry vision, headaches or peripheral edema, and RUQ pain.  She plans on breast feeding. She  is unsure for what she wants for birth control. She received her prenatal care at [facility]   Dating: By ultrasound --->  Estimated Date of Delivery: 05/19/23  Sono:    @31  w 1 d, CWD, normal anatomy, cephalic presentation, 1656 g, 25% EFW   Prenatal History/Complications:  Patient Active Problem List   Diagnosis Date Noted   Supervision of high risk pregnancy, antepartum 04/27/2023   Late prenatal care affecting pregnancy in third trimester 04/27/2023   Sleep apnea 04/27/2023    NURSING  PROVIDER  Office Location [facility] Dating by U/S at 29 wks  [facility] Model Traditional Anatomy U/S  Normal   Initiated care at  [employer]  English              LAB RESULTS   Support Person Taurice(FOB) Genetics NIPS:   low risk female    AFP: too late         NT/IT (FT only) NA    Carrier Screen Horizon: negative  Rhogam  O/Negative/-- (04/29 1559) A1C/GTT Early:             Third trimester:  Flu Vaccine     TDaP Vaccine   Blood Type O/Negative/-- (04/29 1559)  Covid Vaccine  Antibody Negative (04/29 1559)    Rubella 1.06 (04/29 1559)  Feeding Plan Breast & Pump RPR Non Reactive (04/29 1559)  Contraception Undecided HBsAg Negative (04/29 1559)  Circumcision Yes HIV Non Reactive (04/29 1559)  Pediatrician  List given HCVAb Non Reactive (04/29 1559)  Prenatal Classes NA      Pap No results found for: "DIAGPAP"  BTLConsent NA GC/CT Initial:             36wks: -/-  VBAC  Consent NA GBS Negative/-- (04/29 1503) For PCN allergy, check sensitivities        DME Rx [ ]  BP cuff [ ]  Weight Scale Waterbirth  [ ]  Class [ ]  Consent [ ]  CNM visit  PHQ9 & GAD7 [x]  New OB @ 36 weeks Induction  [ ]   Orders Entered [ ] Foley Y/N     Past Medical History: Past Medical History:  Diagnosis Date   Anxiety    Depression    Medical history non-contributory    Sleep apnea    Suicidal ideation     Past Surgical History: Past Surgical History:  Procedure Laterality Date   NO PAST SURGERIES      Obstetrical History: OB History     Gravida  1   Para      Term      Preterm      AB      Living         SAB      IAB      Ectopic      Multiple      Live Births              Social History Social History   Socioeconomic History   Marital status: Single    Spouse name: Not on file   Number of children: Not  on file   Years of education: Not on file   Highest education level: Not on file  Occupational History   Not on file  Tobacco Use   Smoking status: Former    Types: Cigars   Smokeless tobacco: Never  Vaping Use   Vaping Use: Never used  Substance and Sexual Activity   Alcohol use: Not Currently    Comment: occ   Drug use: Never   Sexual activity: Not Currently    Birth control/protection: None  Other Topics Concern   Not on file  Social History Narrative   Not on file   Social Determinants of Health   Financial Resource Strain: Not on file  Food Insecurity: Not on file  Transportation Needs: Not on file  Physical Activity: Not on file  Stress: Not on file  Social Connections: Not on file    Family History: Family History  Problem Relation Age of Onset   Healthy Mother    Diabetes Father     Allergies: No Known Allergies  Medications Prior to Admission  Medication Sig Dispense Refill Last Dose   Prenatal Vit-Fe Fumarate-FA (PRENATAL MULTIVITAMIN) TABS tablet Take 1 tablet by mouth daily at 12 noon.   05/15/2023     Review of Systems   All systems reviewed and negative except as stated in HPI  Blood pressure 118/69, pulse 85, temperature 97.9 F (36.6 C), temperature source Oral, resp. rate 20, height 5\' 5"  (1.651 m), weight  76.7 kg, SpO2 98 %. General appearance: alert, cooperative, and appears stated age Lungs: clear to auscultation bilaterally Heart: regular rate and rhythm Abdomen: soft, non-tender; bowel sounds normal Pelvic: No lesions Extremities: Homans sign is negative, no sign of DVT Presentation: cephalic Fetal monitoring Baseline: 130 bpm, Variability: Good {> 6 bpm), Accelerations: Reactive, and Decelerations: Absent Uterine activity contracting every 2 to 3 minutes Dilation: 2 Effacement (%): 90 Station: -2 Exam by:: Fabiola Backer, RN   Prenatal labs: ABO, Rh: O/Negative/-- (04/29 1559) Antibody: Negative (04/29 1559) Rubella: 1.06 (04/29 1559) RPR: Non Reactive (04/29 1559)  HBsAg: Negative (04/29 1559)  HIV: Non Reactive (04/29 1559)  GBS: Negative/-- (04/29 1503)  1 hr Glucola normal Genetic screening normal Anatomy US normal  Prenatal Transfer Tool  Maternal Diabetes: No Genetic Screening: Normal Maternal Ultrasounds/Referrals: Normal Fetal Ultrasounds or other Referrals:  None Maternal Substance Abuse:  No Significant Maternal Medications:  None Significant Maternal Lab Results:  Group B Strep negative Number of Prenatal Visits:greater than 3 verified prenatal visits Other Comments:  None  No results found for this or any previous visit (from the past 24 hour(s)).  Patient Active Problem List   Diagnosis Date Noted   Supervision of high risk pregnancy, antepartum 04/27/2023   Late prenatal care affecting pregnancy in third trimester 04/27/2023   Sleep apnea 04/27/2023    Assessment/Plan:  Haley Bentley is a 27 y.o. G1P0 at [redacted]w[redacted]d here for SROM  #Labor: expectant management #Pain: Per pt request  #FWB: Category 1 #ID:  GBS negative #MOF: Breast and pump #MOC: unDecided #Circ:  Yes  Myrtie Hawk, DO  05/16/2023, 11:26 PM

## 2023-05-16 NOTE — MAU Note (Signed)
.  Haley Bentley is a 27 y.o. at [redacted]w[redacted]d here in MAU reporting: started cramping around 2200 went to the bathroom and while she was sitting on the toilet felt fluid come out and continued to have episodes of leaking - changed underwear 3 times. Noticed some spotting while using the bathroom here in MAU - denies heavy VB. +FM   Onset of complaint: 2200 Pain score: 8 Vitals:   05/16/23 2309  BP: 118/69  Pulse: 85  Resp: 20  Temp: 97.9 F (36.6 C)  SpO2: 98%     FHT:134 Lab orders placed from triage:  labor eval

## 2023-05-17 ENCOUNTER — Inpatient Hospital Stay (HOSPITAL_COMMUNITY): Payer: BC Managed Care – PPO | Admitting: Anesthesiology

## 2023-05-17 ENCOUNTER — Encounter (HOSPITAL_COMMUNITY): Payer: Self-pay | Admitting: Obstetrics and Gynecology

## 2023-05-17 DIAGNOSIS — O4290 Premature rupture of membranes, unspecified as to length of time between rupture and onset of labor, unspecified weeks of gestation: Secondary | ICD-10-CM | POA: Diagnosis present

## 2023-05-17 DIAGNOSIS — Z3A39 39 weeks gestation of pregnancy: Secondary | ICD-10-CM

## 2023-05-17 LAB — CBC
HCT: 35 % — ABNORMAL LOW (ref 36.0–46.0)
Hemoglobin: 10.9 g/dL — ABNORMAL LOW (ref 12.0–15.0)
MCH: 31.2 pg (ref 26.0–34.0)
MCHC: 31.1 g/dL (ref 30.0–36.0)
MCV: 100.3 fL — ABNORMAL HIGH (ref 80.0–100.0)
Platelets: 218 10*3/uL (ref 150–400)
RBC: 3.49 MIL/uL — ABNORMAL LOW (ref 3.87–5.11)
RDW: 13.2 % (ref 11.5–15.5)
WBC: 12.2 10*3/uL — ABNORMAL HIGH (ref 4.0–10.5)
nRBC: 0 % (ref 0.0–0.2)

## 2023-05-17 LAB — NO BLOOD PRODUCTS

## 2023-05-17 LAB — TYPE AND SCREEN
ABO/RH(D): O NEG
Antibody Screen: NEGATIVE

## 2023-05-17 LAB — RPR: RPR Ser Ql: NONREACTIVE

## 2023-05-17 MED ORDER — EPHEDRINE 5 MG/ML INJ: 10.0000 mg | INTRAVENOUS | Status: DC | PRN

## 2023-05-17 MED ORDER — SIMETHICONE 80 MG PO CHEW: 80.0000 mg | CHEWABLE_TABLET | ORAL | Status: DC | PRN

## 2023-05-17 MED ORDER — DIPHENHYDRAMINE HCL 50 MG/ML IJ SOLN: 12.5000 mg | INTRAMUSCULAR | Status: DC | PRN

## 2023-05-17 MED ORDER — IBUPROFEN 600 MG PO TABS
600.0000 mg | ORAL_TABLET | Freq: Four times a day (QID) | ORAL | Status: DC
  Administered 2023-05-17 – 2023-05-19 (×6): 600 mg via ORAL
  Filled 2023-05-17 (×7): qty 1

## 2023-05-17 MED ORDER — ACETAMINOPHEN 325 MG PO TABS: 650.0000 mg | ORAL_TABLET | ORAL | Status: DC | PRN

## 2023-05-17 MED ORDER — FENTANYL-BUPIVACAINE-NACL 0.5-0.125-0.9 MG/250ML-% EP SOLN
12.0000 mL/h | EPIDURAL | Status: DC | PRN
  Filled 2023-05-17: qty 250

## 2023-05-17 MED ORDER — WITCH HAZEL-GLYCERIN EX PADS: 1.0000 | MEDICATED_PAD | CUTANEOUS | Status: DC | PRN

## 2023-05-17 MED ORDER — LIDOCAINE HCL (PF) 1 % IJ SOLN: 30.0000 mL | INTRAMUSCULAR | Status: DC | PRN

## 2023-05-17 MED ORDER — SENNOSIDES-DOCUSATE SODIUM 8.6-50 MG PO TABS
2.0000 | ORAL_TABLET | Freq: Every day | ORAL | Status: DC
  Administered 2023-05-18 – 2023-05-19 (×2): 2 via ORAL
  Filled 2023-05-17 (×2): qty 2

## 2023-05-17 MED ORDER — FENTANYL CITRATE (PF) 100 MCG/2ML IJ SOLN: 50.0000 ug | INTRAMUSCULAR | Status: DC | PRN

## 2023-05-17 MED ORDER — TRANEXAMIC ACID-NACL 1000-0.7 MG/100ML-% IV SOLN
1000.0000 mg | INTRAVENOUS | Status: AC
  Administered 2023-05-17: 1000 mg via INTRAVENOUS

## 2023-05-17 MED ORDER — TETANUS-DIPHTH-ACELL PERTUSSIS 5-2.5-18.5 LF-MCG/0.5 IM SUSY
0.5000 mL | PREFILLED_SYRINGE | Freq: Once | INTRAMUSCULAR | Status: AC
  Administered 2023-05-18: 0.5 mL via INTRAMUSCULAR
  Filled 2023-05-17: qty 0.5

## 2023-05-17 MED ORDER — LIDOCAINE HCL (PF) 1 % IJ SOLN
INTRAMUSCULAR | Status: DC | PRN
  Administered 2023-05-17: 10 mL via EPIDURAL
  Administered 2023-05-17: 2 mL via EPIDURAL

## 2023-05-17 MED ORDER — PHENYLEPHRINE 80 MCG/ML (10ML) SYRINGE FOR IV PUSH (FOR BLOOD PRESSURE SUPPORT)
80.0000 ug | PREFILLED_SYRINGE | INTRAVENOUS | Status: DC | PRN
  Filled 2023-05-17: qty 10

## 2023-05-17 MED ORDER — COCONUT OIL OIL: 1.0000 | TOPICAL_OIL | Status: DC | PRN

## 2023-05-17 MED ORDER — ONDANSETRON HCL 4 MG PO TABS: 4.0000 mg | ORAL_TABLET | ORAL | Status: DC | PRN

## 2023-05-17 MED ORDER — OXYCODONE-ACETAMINOPHEN 5-325 MG PO TABS: 1.0000 | ORAL_TABLET | ORAL | Status: DC | PRN

## 2023-05-17 MED ORDER — LACTATED RINGERS IV SOLN: 500.0000 mL | INTRAVENOUS | Status: DC | PRN

## 2023-05-17 MED ORDER — MEDROXYPROGESTERONE ACETATE 150 MG/ML IM SUSP: 150.0000 mg | INTRAMUSCULAR | Status: DC | PRN

## 2023-05-17 MED ORDER — OXYTOCIN BOLUS FROM INFUSION
333.0000 mL | Freq: Once | INTRAVENOUS | Status: AC
  Administered 2023-05-17: 333 mL via INTRAVENOUS

## 2023-05-17 MED ORDER — SOD CITRATE-CITRIC ACID 500-334 MG/5ML PO SOLN: 30.0000 mL | ORAL | Status: DC | PRN

## 2023-05-17 MED ORDER — ONDANSETRON HCL 4 MG/2ML IJ SOLN: 4.0000 mg | INTRAMUSCULAR | Status: DC | PRN

## 2023-05-17 MED ORDER — LACTATED RINGERS IV SOLN
500.0000 mL | Freq: Once | INTRAVENOUS | Status: AC
  Administered 2023-05-17: 500 mL via INTRAVENOUS

## 2023-05-17 MED ORDER — OXYTOCIN-SODIUM CHLORIDE 30-0.9 UT/500ML-% IV SOLN
2.5000 [IU]/h | INTRAVENOUS | Status: DC
  Administered 2023-05-17: 2.5 [IU]/h via INTRAVENOUS
  Filled 2023-05-17: qty 500

## 2023-05-17 MED ORDER — ONDANSETRON HCL 4 MG/2ML IJ SOLN
4.0000 mg | Freq: Four times a day (QID) | INTRAMUSCULAR | Status: DC | PRN
  Administered 2023-05-17 (×2): 4 mg via INTRAVENOUS
  Filled 2023-05-17 (×2): qty 2

## 2023-05-17 MED ORDER — OXYCODONE-ACETAMINOPHEN 5-325 MG PO TABS: 2.0000 | ORAL_TABLET | ORAL | Status: DC | PRN

## 2023-05-17 MED ORDER — ZOLPIDEM TARTRATE 5 MG PO TABS: 5.0000 mg | ORAL_TABLET | Freq: Every evening | ORAL | Status: DC | PRN

## 2023-05-17 MED ORDER — FENTANYL-BUPIVACAINE-NACL 0.5-0.125-0.9 MG/250ML-% EP SOLN
EPIDURAL | Status: DC | PRN
  Administered 2023-05-17: 12 mL/h via EPIDURAL

## 2023-05-17 MED ORDER — PHENYLEPHRINE 80 MCG/ML (10ML) SYRINGE FOR IV PUSH (FOR BLOOD PRESSURE SUPPORT): 80.0000 ug | PREFILLED_SYRINGE | INTRAVENOUS | Status: DC | PRN

## 2023-05-17 MED ORDER — BENZOCAINE-MENTHOL 20-0.5 % EX AERO
1.0000 | INHALATION_SPRAY | CUTANEOUS | Status: DC | PRN
  Administered 2023-05-17: 1 via TOPICAL
  Filled 2023-05-17: qty 56

## 2023-05-17 MED ORDER — DIPHENHYDRAMINE HCL 25 MG PO CAPS: 25.0000 mg | ORAL_CAPSULE | Freq: Four times a day (QID) | ORAL | Status: DC | PRN

## 2023-05-17 MED ORDER — DIBUCAINE (PERIANAL) 1 % EX OINT: 1.0000 | TOPICAL_OINTMENT | CUTANEOUS | Status: DC | PRN

## 2023-05-17 MED ORDER — TRANEXAMIC ACID-NACL 1000-0.7 MG/100ML-% IV SOLN
INTRAVENOUS | Status: AC
  Filled 2023-05-17: qty 100

## 2023-05-17 MED ORDER — ACETAMINOPHEN 325 MG PO TABS
650.0000 mg | ORAL_TABLET | ORAL | Status: DC | PRN
  Administered 2023-05-19: 650 mg via ORAL
  Filled 2023-05-17 (×2): qty 2

## 2023-05-17 NOTE — Progress Notes (Signed)
Labor Progress Note  Haley Bentley is a 27 y.o. G1P0 at [redacted]w[redacted]d presented for SROM  S: pt feeling pressure, FHT with varaible decelerations  O:  BP 109/60   Pulse 71   Temp 97.9 F (36.6 C) (Oral)   Resp 18   Ht 5\' 5"  (1.651 m)   Wt 76.7 kg   SpO2 100%   BMI 28.12 kg/m  EFM: 110 bpm/Moderate variability/ 15x15 accels/ Variable decels  CVE: Dilation: 8 Effacement (%): 80 Station: -1 Presentation: Vertex Exam by:: Dr. Camelia Phenes   A&P: 27 y.o. G1P0 [redacted]w[redacted]d  here for SROM as above  #Labor: Progressing well. Expectant management, maternal position changed, + scalp stim #Pain: Family/Friend support and Epidural #FWB: CAT 1- after interventions #GBS negative  Myrtie Hawk, DO FMOB Fellow, Faculty practice [facility], Center for [facility] 05/17/23  8:11 AM

## 2023-05-17 NOTE — Plan of Care (Signed)

## 2023-05-17 NOTE — Progress Notes (Signed)
Labor Progress Note  Haley Bentley is a 27 y.o. G1P0 at [redacted]w[redacted]d presented for SROM  S: Having some discomfort with contractions.   O:  BP (!) 104/91   Pulse 74   Temp 97.9 F (36.6 C) (Oral)   Resp 18   Ht 5\' 5"  (1.651 m)   Wt 76.7 kg   SpO2 100%   BMI 28.12 kg/m  EFM: 115 bpm/Moderate variability/ 15x15 accels/ Variable decels  CVE: Dilation: 9 Effacement (%): 90 Station: -1 Presentation: Vertex Exam by:: Amado Nash, RN   A&P: 27 y.o. G1P0 [redacted]w[redacted]d  here for SROM as above  #Labor: Progressing well. Expectant management.  #Pain: Family/Friend support and Epidural #FWB: CAT 1- maternal position change now with early decels #GBS negative  Lavonda Jumbo, DO FMOB Fellow, Faculty practice [facility], Center for [facility] Healthcare 05/17/23  10:02 AM

## 2023-05-17 NOTE — Progress Notes (Signed)
Labor Progress Note  Haley Bentley is a 27 y.o. G1P0 at [redacted]w[redacted]d presented for SROM  S: Having back discomfort with contractions.   O:  BP (!) 100/34   Pulse 75   Temp 97.9 F (36.6 C) (Oral)   Resp 18   Ht 5\' 5"  (1.651 m)   Wt 76.7 kg   SpO2 100%   BMI 28.12 kg/m  EFM: 110 bpm/Moderate variability/ 15x15 accels/ None   CVE: Dilation: 9 Effacement (%): 90 Station: 0 Presentation: Vertex Exam by:: Kaytlen Lightsey Autry-Lott   A&P: 27 y.o. G1P0 [redacted]w[redacted]d  here for SROM as above  #Labor: Suspected OP given back labor and palpation of anterior fontanelle. Maternal position is flying cowgirl to encourage rotation/descent and cervical dilation.  #Pain: Family/Friend support and Epidural #FWB: CAT 1 #GBS negative  Lavonda Jumbo, DO FMOB Fellow, Faculty practice [facility], Center for [facility] Healthcare 05/17/23  11:43 AM

## 2023-05-17 NOTE — Lactation Note (Signed)
This note was copied from a baby's chart. Lactation Consultation Note  Patient Name: Haley Bentley [id number] Date: 05/17/2023 Age:26 hours  Baby is to spitty to feed at this time. Explained to mom LC would come back later when baby is ready to feed.   Maternal Data    Feeding    LATCH Score                    Lactation Tools Discussed/Used    Interventions    Discharge    Consult Status      Charyl Dancer 05/17/2023, 8:08 PM

## 2023-05-17 NOTE — Lactation Note (Signed)
This note was copied from a baby's chart. Lactation Consultation Note  Patient Name: Haley Bentley [id number] Date: 05/17/2023 Age:26 hours  Attempted to see mom but she was eating. Mom had called out stating baby was choking. NA went to rm. And baby was clear.  LC told mom LC would be back after she finished eating.   Maternal Data    Feeding    LATCH Score                    Lactation Tools Discussed/Used    Interventions    Discharge    Consult Status      Haley Bentley 05/17/2023, 7:50 PM

## 2023-05-17 NOTE — Anesthesia Procedure Notes (Signed)
Epidural Patient location during procedure: OB Start time: 05/17/2023 12:41 AM End time: 05/17/2023 12:52 AM  Staffing Anesthesiologist: Lannie Fields, DO Performed: anesthesiologist   Preanesthetic Checklist Completed: patient identified, IV checked, risks and benefits discussed, monitors and equipment checked, pre-op evaluation and timeout performed  Epidural Patient position: sitting Prep: DuraPrep and site prepped and draped Patient monitoring: continuous pulse ox, blood pressure, heart rate and cardiac monitor Approach: midline Location: L3-L4 Injection technique: LOR air  Needle:  Needle type: Tuohy  Needle gauge: 17 G Needle length: 9 cm Needle insertion depth: 5 cm Catheter type: closed end flexible Catheter size: 19 Gauge Catheter at skin depth: 10 cm Test dose: negative  Assessment Sensory level: T8 Events: blood not aspirated, no cerebrospinal fluid, injection not painful, no injection resistance, no paresthesia and negative IV test  Additional Notes Patient identified. Risks/Benefits/Options discussed with patient including but not limited to bleeding, infection, nerve damage, paralysis, failed block, incomplete pain control, headache, blood pressure changes, nausea, vomiting, reactions to medication both or allergic, itching and postpartum back pain. Confirmed with bedside nurse the patient's most recent platelet count. Confirmed with patient that they are not currently taking any anticoagulation, have any bleeding history or any family history of bleeding disorders. Patient expressed understanding and wished to proceed. All questions were answered. Sterile technique was used throughout the entire procedure. Please see nursing notes for vital signs. Test dose was given through epidural catheter and negative prior to continuing to dose epidural or start infusion. Warning signs of high block given to the patient including shortness of breath, tingling/numbness in  hands, complete motor block, or any concerning symptoms with instructions to call for help. Patient was given instructions on fall risk and not to get out of bed. All questions and concerns addressed with instructions to call with any issues or inadequate analgesia.  Reason for block:procedure for pain

## 2023-05-17 NOTE — Discharge Summary (Signed)
Postpartum Discharge Summary  Date of Service updated***     Patient Name: Haley Bentley DOB: [birth year] MRN: [id number]  Date of admission: 05/16/2023 Delivery date:05/17/2023  Delivering provider: Lavonda Jumbo  Date of discharge: 05/17/2023  Admitting diagnosis: Leakage of amniotic fluid [O42.90] Intrauterine pregnancy: [redacted]w[redacted]d     Secondary diagnosis:  Principal Problem:   Leakage of amniotic fluid Active Problems:   Supervision of high risk pregnancy, antepartum  Additional problems: ***    Discharge diagnosis: {DX.:23714}                                              Post partum procedures:{Postpartum procedures:23558} Augmentation: N/A Complications: None  Hospital course: Onset of Labor With Vaginal Delivery      27 y.o. yo G1P1001 at [redacted]w[redacted]d was admitted in Latent Labor on 05/16/2023. Labor course was uncomplicated.   Membrane Rupture Time/Date: 10:00 PM ,05/16/2023   Delivery Method:Vaginal, Spontaneous  Episiotomy: None  Lacerations:  None  Patient had a postpartum course complicated by ***.  She is ambulating, tolerating a regular diet, passing flatus, and urinating well. Patient is discharged home in stable condition on 05/17/23.  Newborn Data: Birth date:05/17/2023  Birth time:1:57 PM  Gender:Female  Living status:Living  Apgars:9 ,9  Weight:3090 g   Magnesium Sulfate received: {Mag received:30440022} BMZ received: {BMZ received:30440023} Rhophylac:{Rhophylac received:30440032} WUJ:{WJX:91478295} T-DaP:{Tdap:23962} Flu: {AOZ:30865} Transfusion:{Transfusion received:30440034}  Physical exam  Vitals:   05/17/23 1300 05/17/23 1415 05/17/23 1430 05/17/23 1445  BP: 121/75 (!) 129/101 (!) 129/107 126/77  Pulse: 74  84 81  Resp:      Temp:    98.1 F (36.7 C)  TempSrc:    Oral  SpO2:      Weight:      Height:       General: {Exam; general:21111117} Lochia: {Desc; appropriate/inappropriate:30686::"appropriate"} Uterine Fundus: {Desc;  firm/soft:30687} Incision: {Exam; incision:21111123} DVT Evaluation: {Exam; dvt:2111122} Labs: Lab Results  Component Value Date   WBC 12.2 (H) 05/16/2023   HGB 10.9 (L) 05/16/2023   HCT 35.0 (L) 05/16/2023   MCV 100.3 (H) 05/16/2023   PLT 218 05/16/2023      Latest Ref Rng & Units 09/18/2021   12:55 PM  CMP  Glucose 70 - 99 mg/dL 784   BUN 6 - 20 mg/dL 11   Creatinine 6.96 - 1.00 mg/dL 2.95   Sodium 284 - 132 mmol/L 133   Potassium 3.5 - 5.1 mmol/L 3.7   Chloride 98 - 111 mmol/L 102   CO2 22 - 32 mmol/L 24   Calcium 8.9 - 10.3 mg/dL 8.8   Total Protein 6.5 - 8.1 g/dL 8.0   Total Bilirubin 0.3 - 1.2 mg/dL 1.0   Alkaline Phos 38 - 126 U/L 63   AST 15 - 41 U/L 15   ALT 0 - 44 U/L 11    Edinburgh Score:     No data to display           After visit meds:  Allergies as of 05/17/2023   No Known Allergies   Med Rec must be completed prior to using this [facility]***        Discharge home in stable condition Infant Feeding: {Baby feeding:23562} Infant Disposition:{CHL IP OB HOME WITH GMWNUU:72536} Discharge instruction: per After Visit Summary and Postpartum booklet. Activity: Advance as tolerated. Pelvic rest for 6 weeks.  Diet: {OB  ZOXW:96045409} Future Appointments: Future Appointments  Date Time Provider Department Center  05/20/2023  2:15 PM Hermina Staggers, MD [facility]  05/20/2023  3:15 PM [facility]  05/26/2023  2:15 PM Lorriane Shire, MD [facility]  05/28/2023  9:15 AM [facility]   Follow up Visit:  Message sent to [facility] by Autry-Lott on 05/17/2023  Please schedule this patient for a In person postpartum visit in 6 weeks with the following provider: Any provider. Additional Postpartum F/U: n/a   High risk pregnancy complicated by:  Late to prenatal care @ 36 wks Delivery mode:  Vaginal, Spontaneous  Anticipated Birth Control:  Unsure   05/17/2023 Simone Autry-Lott, DO

## 2023-05-17 NOTE — Anesthesia Preprocedure Evaluation (Addendum)
Anesthesia Evaluation  Patient identified by MRN, date of birth, ID band Patient awake    Reviewed: Allergy & Precautions, Patient's Chart, lab work & pertinent test results  Airway Mallampati: II  TM Distance: >3 FB Neck ROM: Full    Dental no notable dental hx.    Pulmonary sleep apnea (noncompliant w/ cpap) , former smoker   Pulmonary exam normal breath sounds clear to auscultation       Cardiovascular negative cardio ROS Normal cardiovascular exam Rhythm:Regular Rate:Normal     Neuro/Psych  PSYCHIATRIC DISORDERS Anxiety Depression    negative neurological ROS     GI/Hepatic negative GI ROS, Neg liver ROS,,,  Endo/Other  negative endocrine ROS    Renal/GU negative Renal ROS  negative genitourinary   Musculoskeletal negative musculoskeletal ROS (+)    Abdominal   Peds negative pediatric ROS (+)  Hematology  (+) Blood dyscrasia, anemia , REFUSES BLOOD PRODUCTSHb 10.9 plt 218   Anesthesia Other Findings   Reproductive/Obstetrics (+) Pregnancy                             Anesthesia Physical Anesthesia Plan  ASA: 3  Anesthesia Plan: Epidural   Post-op Pain Management:    Induction:   PONV Risk Score and Plan: 2  Airway Management Planned: Natural Airway  Additional Equipment: None  Intra-op Plan:   Post-operative Plan:   Informed Consent: I have reviewed the patients History and Physical, chart, labs and discussed the procedure including the risks, benefits and alternatives for the proposed anesthesia with the patient or authorized representative who has indicated his/her understanding and acceptance.       Plan Discussed with: Anesthesiologist  Anesthesia Plan Comments:        Anesthesia Quick Evaluation

## 2023-05-18 LAB — RH IG WORKUP (INCLUDES ABO/RH)
Fetal Screen: NEGATIVE
Unit division: 0

## 2023-05-18 MED ORDER — GELATIN ABSORBABLE 12-7 MM EX MISC: 1.0000 | Freq: Once | CUTANEOUS | Status: DC | PRN

## 2023-05-18 MED ORDER — RHO D IMMUNE GLOBULIN 1500 UNIT/2ML IJ SOSY
300.0000 ug | PREFILLED_SYRINGE | Freq: Once | INTRAMUSCULAR | Status: AC
  Administered 2023-05-18: 300 ug via INTRAVENOUS
  Filled 2023-05-18: qty 2

## 2023-05-18 MED ORDER — WHITE PETROLATUM EX OINT: 1.0000 | TOPICAL_OINTMENT | CUTANEOUS | Status: DC | PRN

## 2023-05-18 MED ORDER — PRENATAL MULTIVITAMIN CH
1.0000 | ORAL_TABLET | Freq: Every day | ORAL | Status: DC
  Administered 2023-05-18: 1 via ORAL
  Filled 2023-05-18: qty 1

## 2023-05-18 MED ORDER — LIDOCAINE 1% INJECTION FOR CIRCUMCISION
0.8000 mL | INJECTION | Freq: Once | INTRAVENOUS | Status: DC
  Filled 2023-05-18: qty 1

## 2023-05-18 MED ORDER — EPINEPHRINE TOPICAL FOR CIRCUMCISION 0.1 MG/ML: 1.0000 [drp] | TOPICAL | Status: DC | PRN

## 2023-05-18 MED ORDER — SUCROSE 24% NICU/PEDS ORAL SOLUTION: 0.5000 mL | OROMUCOSAL | Status: DC | PRN

## 2023-05-18 NOTE — Lactation Note (Signed)
This note was copied from a baby's chart. Lactation Consultation Note  Patient Name: Haley Bentley Date: 05/18/2023 Age:27 hours  RN informed LC that baby is still very spitty.   Maternal Data    Feeding    LATCH Score                    Lactation Tools Discussed/Used    Interventions    Discharge    Consult Status      Charyl Dancer 05/18/2023, 4:35 AM

## 2023-05-18 NOTE — Lactation Note (Addendum)
This note was copied from a baby's chart. Lactation Consultation Note  Patient Name: Boy Citlalli Garguilo [id number] Date: 05/18/2023 Age:26 hours Reason for consult: Initial assessment  P1, Mother states baby has been spitty and latching for only short periods of time.  She pumped 15 ml yesterday with hand pump.  Suggest giving 1/2 of volume to baby when he wakes with spoon and call for Lactation assistance. Provided mother with lactation information sheet.  Visitors came in during consult so LC will follow up later today.  Maternal Data Does the patient have breastfeeding experience prior to this delivery?: No  Feeding Mother's Current Feeding Choice: Breast Milk  Lactation Tools Discussed/Used Tools: Pump Breast pump type: Manual Pumped volume: 15 mL  Interventions Interventions: Education;Hand pump  Consult Status Consult Status: Follow-up Date: 05/19/23 Follow-up type: In-patient    Dahlia Byes [facility] 05/18/2023, 8:34 AM

## 2023-05-18 NOTE — Lactation Note (Signed)
This note was copied from a baby's chart. Lactation Consultation Note  Patient Name: Haley Bentley [id number] Date: 05/18/2023 Age:26 hours  Attempted to see mom but she was sleeping.  Maternal Data    Feeding    LATCH Score                    Lactation Tools Discussed/Used    Interventions    Discharge    Consult Status      Charyl Dancer 05/18/2023, 11:03 PM

## 2023-05-18 NOTE — Anesthesia Postprocedure Evaluation (Signed)
Anesthesia Post Note  Patient: CHELSIA SWALLOWS  Procedure(s) Performed: AN AD HOC LABOR EPIDURAL     Patient location during evaluation: Mother Baby Anesthesia Type: Epidural Level of consciousness: awake and alert Pain management: pain level controlled Vital Signs Assessment: post-procedure vital signs reviewed and stable Respiratory status: spontaneous breathing, nonlabored ventilation and respiratory function stable Cardiovascular status: stable Postop Assessment: no headache, no backache and epidural receding Anesthetic complications: no   No notable events documented.  Last Vitals:  Vitals:   05/18/23 0218 05/18/23 0557  BP: (!) 104/57 116/77  Pulse: 74 76  Resp: 18 17  Temp: 36.6 C 36.6 C  SpO2: 100% 100%    Last Pain:  Vitals:   05/18/23 0557  TempSrc: Axillary  PainSc:    Pain Goal:                   Salome Arnt

## 2023-05-18 NOTE — Clinical Social Work Maternal (Signed)
CLINICAL SOCIAL WORK MATERNAL/CHILD NOTE   Patient Details  Name: Haley Bentley MRN: [id number] Date of Birth: 03/17/1996   Date:  05/18/2023   Clinical Social Worker Initiating Note:  Damesha Lawler      Date/Time: Initiated:  05/18/23/1523      Child's Name:  Taurice Wall Jr 05/17/2023    Biological Parents:  Mother, Father (Gordie Korson 12/16/1997, Taurice Wall 02/22/1996)    Need for Interpreter:  None    Reason for Referral:  Behavioral Health Concerns, Late or No Prenatal Care      Address:  [address] [place] [address]    Phone number:  [phone] (home)      Additional phone number:    Household Members/Support Persons (HM/SP):         HM/SP Name Relationship DOB or Age  HM/SP -1     HM/SP -2     HM/SP -3     HM/SP -4     HM/SP -5     HM/SP -6     HM/SP -7     HM/SP -8         Natural Supports (not living in the home):  Parent    Professional Supports: None    Employment: Full-time    Type of Work:      Education:  Some College    Homebound arranged:     Financial Resources:  Medicaid, Private Insurance     Other Resources:  Food Stamps  , WIC    Cultural/Religious Considerations Which May Impact Care:     Strengths:  Ability to meet basic needs  , Compliance with medical plan  , Home prepared for child  , Pediatrician chosen    Psychotropic Medications:          Pediatrician:    [place] area   Pediatrician List:    [facility] ([address])  [place]   [place]   [place]   [place]   [place]       Pediatrician Fax Number:     Risk Factors/Current Problems:       Cognitive State:  Able to Concentrate  , Alert      Mood/Affect:  Calm  , Interested      CSW Assessment: CSW received consult for hx Anxiety, depression and limited PNC. CSW met with MOB to complete assessment and offer support. CSW entered the room and observed MOB walking around the  room holding the infant. CSW introduced self, CSW role and reason for visit, MOB was agreeable to visit. CSW inquired about how MOB was feeling, MOB reported good. CSW confirmed MOB address and phone number.   CSW inquired about MOB MH hx, MOB reported she dealt with depression and anxiety from childhood trauma. MOB reported a stable mood throughout pregnancy. MOB reported she did have some anxiety around delivery but it was manageable. CSW inquired about medications or therapy for her symptoms. MOB reported none. CSW assessed for safety, MOB denied any SI, HI or DV. CSW provided education regarding the baby blues period vs. perinatal mood disorders, discussed treatment and gave resources for mental health follow up if concerns arise.  CSW recommends self-evaluation during the postpartum time period using the New Mom Checklist from Postpartum Progress and encouraged MOB to contact a medical professional if symptoms are noted at any time. MOB identified FOB, and her dad and his wife as her supports. CSW inquired about MOB   limited PNC MOB reported she was initially tole she was further along than she really was and there was conflicting information about her appointments. CSW explained the hospital drug screen policy due to lack of PNC, MOB verbalized understanding. CSW notified MOB the infants UDS and CDS were pending, MOB verbalized understanding. MOB reported to CSW she did eat THC gummies to help her eat but they were bough from a local smoke shop.  CSW provided review of Sudden Infant Death Syndrome (SIDS) precautions.  MOB reported she has all necessary items for the infant including a bassinet, crib and a car seat.  CSW identifies no further need for intervention and no barriers to discharge at this time.   CSW Plan/Description:  No Further Intervention Required/No Barriers to Discharge, Sudden Infant Death Syndrome (SIDS) Education, Perinatal Mood and Anxiety Disorder (PMADs) Education, CSW Will  Continue to Monitor Umbilical Cord Tissue Drug Screen Results and Make Report if Warranted, Hospital Drug Screen Policy Information      Britiny Defrain M Reah Justo, LCSW 05/18/2023, 3:31 PM 

## 2023-05-18 NOTE — Progress Notes (Signed)
Post Partum Day 1 Subjective: no complaints, up ad lib, voiding, tolerating PO, and no SOB/Dizziness. Undecided on birth control, but will likely choose condoms.  Objective: Blood pressure 116/77, pulse 76, temperature 97.9 F (36.6 C), temperature source Axillary, resp. rate 17, height 5\' 5"  (1.651 m), weight 76.7 kg, SpO2 100 %, unknown if currently breastfeeding.  Physical Exam:  General: alert, cooperative, and no distress Lochia: appropriate Uterine Fundus: firm Incision: N/A DVT Evaluation: No evidence of DVT seen on physical exam. No significant calf/ankle edema.  Assessment/Plan: Continue routine postpartum care. Reassess contraception tomorrow Plan for discharge tomorrow   LOS: 2 days   Tiffany Kocher, DO 05/18/2023, 7:53 AM

## 2023-05-19 LAB — RH IG WORKUP (INCLUDES ABO/RH): Gestational Age(Wks): 39.5

## 2023-05-19 MED ORDER — SENNOSIDES-DOCUSATE SODIUM 8.6-50 MG PO TABS: 2.0000 | ORAL_TABLET | Freq: Every day | ORAL | 0 refills | Status: DC | PRN

## 2023-05-19 MED ORDER — IBUPROFEN 600 MG PO TABS: 600.0000 mg | ORAL_TABLET | Freq: Three times a day (TID) | ORAL | 0 refills | Status: DC | PRN

## 2023-05-19 NOTE — Lactation Note (Signed)
This note was copied from a baby's chart. Lactation Consultation Note  Patient Name: Haley Bentley [id number] Date: 05/19/2023 Age:26 hours Reason for consult: Follow-up assessment  P1, Mother has been breastfeeding and pumping after feeds and giving baby additional volume.  Provided written milk storage and feeding guidelines.  Feed on demand with cues.  Goal 8-12+ times per day after first 24 hrs.  Reviewed engorgement care and monitoring voids/stools. Stork pump paperwork sent.   Maternal Data Has patient been taught Hand Expression?: Yes Does the patient have breastfeeding experience prior to this delivery?: No  Feeding Mother's Current Feeding Choice: Breast Milk  LATCH Score Latch: Grasps breast easily, tongue down, lips flanged, rhythmical sucking.  Audible Swallowing: A few with stimulation  Type of Nipple: Everted at rest and after stimulation  Comfort (Breast/Nipple): Soft / non-tender  Hold (Positioning): No assistance needed to correctly position infant at breast.  LATCH Score: 9   Lactation Tools Discussed/Used Tools: Pump Breast pump type: Double-Electric Breast Pump;Manual Pump Education: Milk Storage Reason for Pumping: supplementation Pumped volume: 20 mL  Interventions Interventions: DEBP;Education;Hand pump  Discharge Discharge Education: Engorgement and breast care;Warning signs for feeding baby Pump: Stork Pump (paperwork sent)  Consult Status Consult Status: Complete Date: 05/19/23    Dahlia Byes [facility] 05/19/2023, 10:42 AM

## 2023-05-20 ENCOUNTER — Encounter: Payer: BC Managed Care – PPO | Admitting: Obstetrics and Gynecology

## 2023-05-20 ENCOUNTER — Other Ambulatory Visit: Payer: BC Managed Care – PPO

## 2023-05-26 ENCOUNTER — Encounter: Payer: BC Managed Care – PPO | Admitting: Obstetrics and Gynecology

## 2023-05-26 ENCOUNTER — Telehealth (HOSPITAL_COMMUNITY): Payer: Self-pay | Admitting: *Deleted

## 2023-05-26 NOTE — Telephone Encounter (Signed)
No phone connection.  Duffy Rhody, [place] 05-26-2023 at 9:30am

## 2023-05-28 ENCOUNTER — Other Ambulatory Visit: Payer: BC Managed Care – PPO

## 2023-06-29 ENCOUNTER — Encounter: Payer: Self-pay | Admitting: Obstetrics & Gynecology

## 2023-06-29 ENCOUNTER — Other Ambulatory Visit: Payer: Self-pay

## 2023-06-29 ENCOUNTER — Ambulatory Visit: Payer: BC Managed Care – PPO | Admitting: Advanced Practice Midwife

## 2023-06-29 ENCOUNTER — Other Ambulatory Visit (HOSPITAL_COMMUNITY)
Admission: RE | Admit: 2023-06-29 | Discharge: 2023-06-29 | Disposition: A | Discharge status: Home / Self Care | Payer: Medicaid Other | Source: Ambulatory Visit | Attending: Advanced Practice Midwife | Admitting: Advanced Practice Midwife

## 2023-06-29 ENCOUNTER — Ambulatory Visit (INDEPENDENT_AMBULATORY_CARE_PROVIDER_SITE_OTHER): Payer: BC Managed Care – PPO | Admitting: Obstetrics & Gynecology

## 2023-06-29 VITALS — BP 120/88 | HR 82 | Ht 65.0 in | Wt 148.1 lb

## 2023-06-29 DIAGNOSIS — R102 Pelvic and perineal pain: Secondary | ICD-10-CM | POA: Insufficient documentation

## 2023-06-29 DIAGNOSIS — Z124 Encounter for screening for malignant neoplasm of cervix: Secondary | ICD-10-CM

## 2023-06-29 LAB — POCT URINALYSIS DIP (DEVICE)
Bilirubin Urine: NEGATIVE
Glucose, UA: NEGATIVE mg/dL
Hgb urine dipstick: NEGATIVE
Ketones, ur: NEGATIVE mg/dL
Leukocytes,Ua: NEGATIVE
Nitrite: NEGATIVE
Protein, ur: NEGATIVE mg/dL
Specific Gravity, Urine: 1.02 (ref 1.005–1.030)
Urobilinogen, UA: 0.2 mg/dL (ref 0.0–1.0)
pH: 7 (ref 5.0–8.0)

## 2023-06-29 MED ORDER — CYCLOBENZAPRINE HCL 5 MG PO TABS
5.0000 mg | ORAL_TABLET | Freq: Three times a day (TID) | ORAL | 0 refills | Status: DC | PRN
Start: 2023-06-29 — End: 2024-01-15

## 2023-06-29 NOTE — Progress Notes (Signed)
    Post Partum Visit Note  Haley Bentley is a 27 y.o. G70P1001 female who presents for a postpartum visit. She is 6 weeks postpartum following a normal spontaneous vaginal delivery.  I have fully reviewed the prenatal and intrapartum course. The delivery was at 39/5 gestational weeks.  Anesthesia: epidural. Postpartum course has been complicated by perineal pain. Baby is doing well. Baby is feeding by breast. Bleeding staining only. Bowel function is normal. Bladder function is normal. Patient is not sexually active. Contraception method is none. Postpartum depression screening: negative.   The pregnancy intention screening data noted above was reviewed. Potential methods of contraception were discussed. The patient elected to proceed with No data recorded.    Health Maintenance Due  Topic Date Due   COVID-19 Vaccine (1) Never done   HPV VACCINES (1 - 2-dose series) Never done   PAP-Cervical Cytology Screening  Never done   PAP SMEAR-Modifier  Never done    The following portions of the patient's history were reviewed and updated as appropriate: allergies, current medications, past family history, past medical history, past social history, past surgical history, and problem list.  Review of Systems Genitourinary:positive for pain at the posterior introitus 3-4 times a day precipitated by movement or position. Only lasts a few seconds but the area is tender as well and she is avoiding sex  Objective:  There were no vitals taken for this visit.   General:  alert, cooperative, and no distress   Breasts:  not indicated  Lungs:   Heart:    Abdomen: soft, non-tender; bowel sounds normal; no masses,  no organomegaly   Wound   GU exam:   Vaginal and cervix normal but tender at post forchette with no lesion       Assessment:    There are no diagnoses linked to this encounter.  6 week postpartum exam.   Plan:   Essential components of care per ACOG recommendations:  1.  Mood and  well being: Patient with negative depression screening today. Reviewed local resources for support.  - Patient tobacco use? No.   - hx of drug use? No.    2. Infant care and feeding:  -Patient currently breastmilk feeding? Yes. Discussed returning to work and pumping.  -Social determinants of health (SDOH) reviewed in EPIC. No concerns  3. Sexuality, contraception and birth spacing - Patient does not want a pregnancy in the next year.  Desired family size is 2 children.  - Reviewed reproductive life planning. Reviewed contraceptive methods based on pt preferences and effectiveness.  Patient desired Abstinence today.   - Discussed birth spacing of 18 months  4. Sleep and fatigue -Encouraged family/partner/community support of 4 hrs of uninterrupted sleep to help with mood and fatigue  5. Physical Recovery  - Discussed patients delivery and complications. She describes her labor as good. - Patient had a Vaginal, no problems at delivery. Patient had no laceration. Perineal healing reviewed. Patient expressed understanding - Patient has urinary incontinence? No. - Patient is safe to resume physical and sexual activity Will try Flexeril due to possible spasms 6.  Health Maintenance - HM due items addressed Yes - Last pap smear No results found for: "DIAGPAP" Pap smear done at today's visit.  -Breast Cancer screening indicated? No.   7. Chronic Disease/Pregnancy Condition follow up: None   Adam Phenix, MD  Center for [facility], [facility]

## 2023-06-30 LAB — CERVICOVAGINAL ANCILLARY ONLY
Bacterial Vaginitis (gardnerella): NEGATIVE
Candida Glabrata: NEGATIVE
Candida Vaginitis: NEGATIVE
Chlamydia: NEGATIVE
Comment: NEGATIVE
Comment: NEGATIVE
Comment: NEGATIVE
Comment: NEGATIVE
Comment: NEGATIVE
Comment: NORMAL
Neisseria Gonorrhea: NEGATIVE
Trichomonas: NEGATIVE

## 2023-07-08 LAB — CYTOLOGY - PAP
Diagnosis: NEGATIVE
Diagnosis: REACTIVE

## 2023-08-24 ENCOUNTER — Telehealth: Payer: BC Managed Care – PPO | Admitting: Physician Assistant

## 2023-08-24 DIAGNOSIS — R102 Pelvic and perineal pain: Secondary | ICD-10-CM

## 2023-08-25 NOTE — Progress Notes (Signed)
Because this is an ongoing and recurring issue, and needs further assessment than can be given via e-visit, I feel your condition warrants further evaluation and I recommend that you be seen in a face to face visit. I would first reach out to your GYN office this morning when they open.    NOTE: There will be NO CHARGE for this eVisit   If you are having a true medical emergency please call 911.      For an urgent face to face visit, [place] has eight urgent care centers for your convenience:   NEW!! [facility] Health Urgent Care Center at [facility] Get Driving Directions [phone] [address], [address] [place], [address]    [facility] Health Urgent Care Center at [facility] Get Driving Directions [phone] [address] [place], [place] [address]   [facility] Health Urgent Care Center [facility]) Get Driving Directions [phone] [address] [place], [place] [address]  [facility]gent Care Center [facility]) Get Driving Directions [phone] [address] [place],  [place]  [address]  [facility] Health Urgent Care Center [facility] - at [employer]  [phone] [address] [place],  [place] [address]   [facility] Health Urgent Care at [facility] Get Driving Directions [phone] [address] [place], [place] [address]   [facility] Health Urgent Care at [facility] Get Driving Directions  [phone] [address] [place], [place] [address]   [facility] Health Urgent Care at [facility] Directions [phone] [address], [address] [place], [place] [address]  Your MyChart E-visit questionnaire answers were reviewed by a board certified advanced clinical practitioner to complete your personal care plan based on your specific symptoms.  Thank you for using e-Visits.

## 2023-09-15 ENCOUNTER — Ambulatory Visit: Payer: Medicaid Other | Admitting: Certified Nurse Midwife

## 2023-09-15 DIAGNOSIS — R309 Painful micturition, unspecified: Secondary | ICD-10-CM

## 2023-09-15 DIAGNOSIS — R102 Pelvic and perineal pain: Secondary | ICD-10-CM

## 2024-01-15 ENCOUNTER — Other Ambulatory Visit: Payer: Self-pay

## 2024-01-15 ENCOUNTER — Ambulatory Visit: Payer: Medicaid Other | Admitting: Family Medicine

## 2024-01-15 ENCOUNTER — Other Ambulatory Visit (HOSPITAL_COMMUNITY)
Admission: RE | Admit: 2024-01-15 | Discharge: 2024-01-15 | Disposition: A | Discharge status: Home / Self Care | Payer: Medicaid Other | Source: Ambulatory Visit | Attending: Family Medicine | Admitting: Family Medicine

## 2024-01-15 ENCOUNTER — Encounter: Payer: Self-pay | Admitting: Family Medicine

## 2024-01-15 VITALS — BP 118/80 | HR 83 | Ht 65.0 in | Wt 180.1 lb

## 2024-01-15 DIAGNOSIS — Z202 Contact with and (suspected) exposure to infections with a predominantly sexual mode of transmission: Secondary | ICD-10-CM | POA: Insufficient documentation

## 2024-01-15 DIAGNOSIS — N921 Excessive and frequent menstruation with irregular cycle: Secondary | ICD-10-CM | POA: Diagnosis not present

## 2024-01-15 DIAGNOSIS — N939 Abnormal uterine and vaginal bleeding, unspecified: Secondary | ICD-10-CM

## 2024-01-15 DIAGNOSIS — Z1331 Encounter for screening for depression: Secondary | ICD-10-CM | POA: Diagnosis not present

## 2024-01-15 LAB — POCT URINE PREGNANCY: Preg Test, Ur: NEGATIVE

## 2024-01-15 LAB — POCT PREGNANCY, URINE: Preg Test, Ur: NEGATIVE

## 2024-01-15 MED ORDER — NATAZIA 3/2-2/2-3/1 MG PO TABS
1.0000 | ORAL_TABLET | Freq: Every day | ORAL | 11 refills | Status: DC
Start: 2024-01-15 — End: 2024-07-26

## 2024-01-15 MED ORDER — NAPROXEN 500 MG PO TABS
500.0000 mg | ORAL_TABLET | Freq: Two times a day (BID) | ORAL | 0 refills | Status: DC
Start: 2024-01-15 — End: 2024-07-26

## 2024-01-15 MED ORDER — PREPLUS 27-1 MG PO TABS
1.0000 | ORAL_TABLET | Freq: Every day | ORAL | 13 refills | Status: AC
Start: 2024-01-15 — End: ?

## 2024-01-15 NOTE — Progress Notes (Signed)
   GYNECOLOGY OFFICE VISIT NOTE  History:   Haley Bentley is a 28 y.o. G1P1001 here today for abnormal menses. States bleeding since 10/2023, sometimes heavy (3-4 pads a day), not sexually active, not on birth control. She does have some days in which she doesn't have bleeding but most days she has at least some spotting.  States bright red to light pink.  Unsure of contract with STI, would like testing today as well. Upreg neg today in clinic. She denies any abnormal vaginal discharge, pelvic pain or other concerns.    Past Medical History:  Diagnosis Date   Anxiety    Depression    Medical history non-contributory    Sleep apnea    Suicidal ideation     Past Surgical History:  Procedure Laterality Date   NO PAST SURGERIES      The following portions of the patient's history were reviewed and updated as appropriate: allergies, current medications, past family history, past medical history, past social history, past surgical history and problem list.   Health Maintenance:  Normal pap on 06/29/23.   Review of Systems:  Pertinent items noted in HPI and remainder of comprehensive ROS otherwise negative.  Physical Exam:  BP 118/80   Pulse 83   Ht 5\' 5"  (1.651 m)   Wt 180 lb 1.6 oz (81.7 kg)   BMI 29.97 kg/m  CONSTITUTIONAL: Well-developed, well-nourished female in no acute distress.  HEENT:  Normocephalic, atraumatic. External right and left ear normal. No scleral icterus.  NECK: Normal range of motion, supple, no masses noted on observation SKIN: No rash noted. Not diaphoretic. No erythema. No pallor. MUSCULOSKELETAL: Normal range of motion. No edema noted. NEUROLOGIC: Alert and oriented to person, place, and time. Normal muscle tone coordination.  PSYCHIATRIC: Normal mood and affect. Normal behavior. Normal judgment and thought content. CARDIOVASCULAR: Normal heart rate noted RESPIRATORY: Effort and breath sounds normal, no problems with respiration noted ABDOMEN: No masses  noted. No other overt distention noted.   PELVIC: Deferred  Labs and Imaging Results for orders placed or performed in visit on 01/15/24 (from the past week)  Pregnancy, urine POC   Collection Time: 01/15/24  9:05 AM  Result Value Ref Range   Preg Test, Ur NEGATIVE NEGATIVE   No results found.    Assessment and Plan:      1. Abnormal uterine bleeding (AUB) (Primary) - POCT urine pregnancy - naproxen (NAPROSYN) 500 MG tablet; Take 1 tablet (500 mg total) by mouth 2 (two) times daily with a meal.  Dispense: 60 tablet; Refill: 0 - Prenatal Vit-Fe Fumarate-FA (PREPLUS) 27-1 MG TABS; Take 1 tablet by mouth daily.  Dispense: 30 tablet; Refill: 13  2. Menorrhagia with irregular cycle - Estradiol Valerate-Dienogest (NATAZIA) 3/2-2/2-3/1 MG tablet; Take 1 tablet by mouth daily.  Dispense: 28 tablet; Refill: 11 - Prenatal Vit-Fe Fumarate-FA (PREPLUS) 27-1 MG TABS; Take 1 tablet by mouth daily.  Dispense: 30 tablet; Refill: 13  3. Possible exposure to STI - Cervicovaginal ancillary only - CBC - HIV antibody (with reflex) - RPR+HBsAg+HCVAb+...     Routine preventative health maintenance measures emphasized. Please refer to After Visit Summary for other counseling recommendations.   Return in about 3 months (around 04/14/2024) for follow up Gyn appt for AUB.     Hessie Dibble, MD OB Fellow, Faculty [facility], Center for [facility] 01/15/2024 9:16 AM

## 2024-01-16 LAB — CBC
Hematocrit: 40.1 % (ref 34.0–46.6)
Hemoglobin: 13.1 g/dL (ref 11.1–15.9)
MCH: 32.4 pg (ref 26.6–33.0)
MCHC: 32.7 g/dL (ref 31.5–35.7)
MCV: 99 fL — ABNORMAL HIGH (ref 79–97)
Platelets: 179 10*3/uL (ref 150–450)
RBC: 4.04 x10E6/uL (ref 3.77–5.28)
RDW: 10.6 % — ABNORMAL LOW (ref 11.7–15.4)
WBC: 6.3 10*3/uL (ref 3.4–10.8)

## 2024-01-16 LAB — RPR+HBSAG+HCVAB+...
HIV Screen 4th Generation wRfx: NONREACTIVE
Hep C Virus Ab: NONREACTIVE
Hepatitis B Surface Ag: NEGATIVE
RPR Ser Ql: NONREACTIVE

## 2024-01-18 LAB — CERVICOVAGINAL ANCILLARY ONLY
Bacterial Vaginitis (gardnerella): NEGATIVE
Candida Glabrata: NEGATIVE
Candida Vaginitis: NEGATIVE
Chlamydia: NEGATIVE
Comment: NEGATIVE
Comment: NEGATIVE
Comment: NEGATIVE
Comment: NEGATIVE
Comment: NEGATIVE
Comment: NORMAL
Neisseria Gonorrhea: NEGATIVE
Trichomonas: NEGATIVE

## 2024-05-01 ENCOUNTER — Encounter (HOSPITAL_COMMUNITY): Payer: Self-pay

## 2024-05-01 ENCOUNTER — Ambulatory Visit (HOSPITAL_COMMUNITY)
Admission: EM | Admit: 2024-05-01 | Discharge: 2024-05-01 | Disposition: A | Discharge status: Home / Self Care | Attending: Internal Medicine | Admitting: Internal Medicine

## 2024-05-01 DIAGNOSIS — S39012A Strain of muscle, fascia and tendon of lower back, initial encounter: Secondary | ICD-10-CM

## 2024-05-01 MED ORDER — PREDNISONE 20 MG PO TABS: 40.0000 mg | ORAL_TABLET | Freq: Every day | ORAL | 0 refills | Status: AC

## 2024-05-01 MED ORDER — DEXAMETHASONE SODIUM PHOSPHATE 10 MG/ML IJ SOLN
10.0000 mg | Freq: Once | INTRAMUSCULAR | Status: AC
  Administered 2024-05-01: 10 mg via INTRAMUSCULAR

## 2024-05-01 MED ORDER — DEXAMETHASONE SODIUM PHOSPHATE 10 MG/ML IJ SOLN
INTRAMUSCULAR | Status: AC
  Filled 2024-05-01: qty 1

## 2024-05-01 MED ORDER — CYCLOBENZAPRINE HCL 5 MG PO TABS: 5.0000 mg | ORAL_TABLET | Freq: Three times a day (TID) | ORAL | 0 refills | Status: DC | PRN

## 2024-05-01 MED ORDER — LIDOCAINE 5 % EX PTCH: 1.0000 | MEDICATED_PATCH | CUTANEOUS | 0 refills | Status: DC

## 2024-05-01 NOTE — ED Triage Notes (Signed)
 Patient reports that she has had bilateral low back pain x 5 days. Patient denies radiation of pain to her buttocks or legs.  Patient has been using lidocaine  patches, icy hot, and ice her   lower back.   Patient states she has had intermittent low back pain for the past 3-4 months.

## 2024-05-01 NOTE — ED Provider Notes (Signed)
 [facility]    CSN: [id number] Arrival date & time: 05/01/24  1020      History   Chief Complaint Chief Complaint  Patient presents with   Back Pain    HPI Haley Bentley is a 28 y.o. female.   28 year old female who presents urgent care with complaints of lower back pain.  This started about 5 days ago.  She reports that she was working out that morning and then that night she began having symptoms.  The pain is located across the lower back on either side of the spine.  She denies any injury to the area.  She denies any bowel or bladder incontinence.  She is not having any weakness in her lower extremities.  There is no radiation of pain down to her buttocks or legs.  She has had similar pain a few months ago but this went away relatively quickly.  She has had a back injury that occurred about 4 years ago in which she had some pain in her back for about a year.  She reports at that time someone had fell on top of her but she never actually went to the doctor for this.  She denies dysuria, hematuria, abdominal pain, nausea, vomiting, fevers, chills.  She has been using over-the-counter lidocaine  patches, IcyHot and ice.  She does have an infant at home and has to lift a fair amount during the day due to this.  She denies any possibility of pregnancy.   Back Pain Associated symptoms: no abdominal pain, no chest pain, no dysuria and no fever     Past Medical History:  Diagnosis Date   Anxiety    Depression    Medical history non-contributory    Sleep apnea    Suicidal ideation     Patient Active Problem List   Diagnosis Date Noted   Sleep apnea 04/27/2023    Past Surgical History:  Procedure Laterality Date   NO PAST SURGERIES      OB History     Gravida  1   Para  1   Term  1   Preterm      AB      Living  1      SAB      IAB      Ectopic      Multiple  0   Live Births  1            Home Medications    Prior to Admission  medications   Medication Sig Start Date End Date Taking? Authorizing Provider  Estradiol Valerate-Dienogest (NATAZIA ) 3/2-2/2-3/1 MG tablet Take 1 tablet by mouth daily. 01/15/24   Ebony Goldstein, MD  naproxen  (NAPROSYN ) 500 MG tablet Take 1 tablet (500 mg total) by mouth 2 (two) times daily with a meal. 01/15/24   Ebony Goldstein, MD  Prenatal Vit-Fe Fumarate-FA (PREPLUS) 27-1 MG TABS Take 1 tablet by mouth daily. 01/15/24   Ebony Goldstein, MD    Family History Family History  Problem Relation Age of Onset   Healthy Mother    Diabetes Father     Social History Social History   Tobacco Use   Smoking status: Former    Types: Cigars   Smokeless tobacco: Never  Vaping Use   Vaping status: Never Used  Substance Use Topics   Alcohol use: Not Currently    Comment: occ   Drug use: Never     Allergies   Patient has  no known allergies.   Review of Systems Review of Systems  Constitutional:  Negative for chills and fever.  HENT:  Negative for ear pain and sore throat.   Eyes:  Negative for pain and visual disturbance.  Respiratory:  Negative for cough and shortness of breath.   Cardiovascular:  Negative for chest pain and palpitations.  Gastrointestinal:  Negative for abdominal pain and vomiting.  Genitourinary:  Negative for difficulty urinating, dysuria, frequency, hematuria and urgency.  Musculoskeletal:  Positive for back pain. Negative for arthralgias and gait problem.  Skin:  Negative for color change and rash.  Neurological:  Negative for seizures and syncope.  All other systems reviewed and are negative.    Physical Exam Triage Vital Signs ED Triage Vitals  Encounter Vitals Group     BP 05/01/24 1030 117/82     Systolic BP Percentile --      Diastolic BP Percentile --      Pulse Rate 05/01/24 1030 99     Resp 05/01/24 1030 14     Temp 05/01/24 1030 97.6 F (36.4 C)     Temp Source 05/01/24 1030 Oral     SpO2 05/01/24 1030 96 %     Weight --      Height --       Head Circumference --      Peak Flow --      Pain Score 05/01/24 1029 6     Pain Loc --      Pain Education --      Exclude from Growth Chart --    No data found.  Updated Vital Signs BP 117/82 (BP Location: Right Arm)   Pulse 99   Temp 97.6 F (36.4 C) (Oral)   Resp 14   LMP 02/28/2024 (Approximate)   SpO2 96%   Visual Acuity Right Eye Distance:   Left Eye Distance:   Bilateral Distance:    Right Eye Near:   Left Eye Near:    Bilateral Near:     Physical Exam Vitals and nursing note reviewed.  Constitutional:      General: She is not in acute distress.    Appearance: She is well-developed.  HENT:     Head: Normocephalic and atraumatic.     Right Ear: Tympanic membrane normal.     Left Ear: Tympanic membrane normal.  Eyes:     Conjunctiva/sclera: Conjunctivae normal.  Cardiovascular:     Rate and Rhythm: Normal rate and regular rhythm.     Pulses:          Dorsalis pedis pulses are 2+ on the right side and 2+ on the left side.       Posterior tibial pulses are 2+ on the right side and 2+ on the left side.     Heart sounds: No murmur heard. Pulmonary:     Effort: Pulmonary effort is normal. No respiratory distress.     Breath sounds: Normal breath sounds.  Abdominal:     Palpations: Abdomen is soft.     Tenderness: There is no abdominal tenderness.  Musculoskeletal:        General: No swelling.     Cervical back: Neck supple.     Lumbar back: Spasms and tenderness (Along the lumbar muscles, no tenderness over the spine) present. No swelling, edema or deformity. Decreased range of motion (Mildly decreased due to spasm). Negative right straight leg raise test and negative left straight leg raise test.  Skin:    General: Skin  is warm and dry.     Capillary Refill: Capillary refill takes less than 2 seconds.  Neurological:     General: No focal deficit present.     Mental Status: She is alert and oriented to person, place, and time.     Motor: No weakness.      Gait: Gait normal.  Psychiatric:        Mood and Affect: Mood normal.      UC Treatments / Results  Labs (all labs ordered are listed, but only abnormal results are displayed) Labs Reviewed - No data to display  EKG   Radiology No results found.  Procedures Procedures (including critical care time)  Medications Ordered in UC Medications - No data to display  Initial Impression / Assessment and Plan / UC Course  I have reviewed the triage vital signs and the nursing notes.  Pertinent labs & imaging results that were available during my care of the patient were reviewed by me and considered in my medical decision making (see chart for details).     Strain of lumbar region, initial encounter   Symptoms and physical exam findings are most consistent with a muscular strain in the lumbar region of the back.  Imaging is deferred as there is no specific injury and no bony tenderness.  There is no urinary symptoms or CVA tenderness to suggest a UTI source.  Reassuringly, the physical exam shows no significant weakness or neurological deficit and there is no radiating pain into the legs.  We can treat this with anti-inflammatories, stretching and muscle relaxers.  Discharge instructions: Decadron  injection given today in the clinic. This is a steroid to help with inflammation and pain. Prednisone 40 mg (2 tablets) once daily for 5 days.  Will have the patient start this medication on 5/5. Flexeril  5 mg every 8 hours as needed for muscle spasms.  Advised patient to use caution as this medication can cause drowsiness.  Lidocaine  patch directly to the effected area on the back.  Advised the patient to apply one every 24 hours as needed for pain. Discontinue if rash or irritation develops from the adhesive.  Light stretching and yoga for the next week but avoid heavy lifting for the next 7 days. If the patient uses a heating pad, then advised her not apply over the lidocaine  patch.  Return  to urgent care or PCP if symptoms worsen or fail to resolve.    Final Clinical Impressions(s) / UC Diagnoses   Final diagnoses:  None   Discharge Instructions   None    ED Prescriptions   None    PDMP not reviewed this encounter.   Kreg Pesa, [place] 05/01/24 1106

## 2024-05-01 NOTE — Discharge Instructions (Addendum)
 Symptoms and physical exam findings are most consistent with a muscular strain in the lumbar region of the back.  Reassuringly, the physical exam shows no significant weakness and there is no radiating pain into the legs.  We can treat this with anti-inflammatories, stretching and muscle relaxers.  Discharge instructions: Decadron  injection given today in the clinic. This is a steroid to help with inflammation and pain. Prednisone 40 mg (2 tablets) once daily for 5 days. Take this in the morning.  This is a steroid to help with inflammation and pain.  Start this medication on 5/5. Flexeril  5 mg every 8 hours as needed for muscle spasms.  Use caution as this medication can cause drowsiness.  Lidocaine  patch directly to the effected area on the back. Apply one every 24 hours as needed for pain. Discontinue if rash or irritation develops from the adhesive.  Light stretching and yoga for the next week but avoid heavy lifting for the next 7 days. If you use a heating pad, do not apply over the lidocaine  patch.  Return to urgent care or PCP if symptoms worsen or fail to resolve.

## 2024-07-26 ENCOUNTER — Encounter (HOSPITAL_COMMUNITY): Payer: Self-pay | Admitting: *Deleted

## 2024-07-26 ENCOUNTER — Ambulatory Visit (HOSPITAL_COMMUNITY)
Admission: EM | Admit: 2024-07-26 | Discharge: 2024-07-26 | Disposition: A | Discharge status: Home / Self Care | Attending: Emergency Medicine | Admitting: Emergency Medicine

## 2024-07-26 ENCOUNTER — Other Ambulatory Visit: Payer: Self-pay

## 2024-07-26 DIAGNOSIS — J069 Acute upper respiratory infection, unspecified: Secondary | ICD-10-CM

## 2024-07-26 MED ORDER — AZELASTINE HCL 0.1 % NA SOLN: 2.0000 | Freq: Two times a day (BID) | NASAL | 0 refills | Status: AC

## 2024-07-26 MED ORDER — BENZONATATE 100 MG PO CAPS: 100.0000 mg | ORAL_CAPSULE | Freq: Three times a day (TID) | ORAL | 0 refills | Status: AC

## 2024-07-26 NOTE — ED Triage Notes (Signed)
 PT reports she visited her sister a month ago and has had a cough ,Ha ,sore throat and nausea. Pt has not taken any OTC for Sx's. Pt reports she still has all Sx's today.

## 2024-07-26 NOTE — ED Provider Notes (Signed)
 [facility]    CSN: [id number] Arrival date & time: 07/26/24  9052      History   Chief Complaint Chief Complaint  Patient presents with   Cough   Headache   Nausea    HPI Haley Bentley is a 28 y.o. female.   Patient presents with persistent cough and congestion for about 1 month. Patient states that she went to [place]  about a month and half ago to visit family and the cough and congestion began shortly after this. Patient states that she initially had chills and body aches, but reports these have subsided.  Patient states that her symptoms did subside some a few weeks ago, but then restarted abut a week ago and now also has a sore throat. Patient states that she has been drinking hot tea and honey with minimal relief. Patient denies taking any medication for her symptoms.   Patient also reports LMP about 2 months ago and reports history of irregular menstrual cycles. Patient states that she has not been sexually active for over a year. Patient therefore declines any concern for pregnancy.   The history is provided by the patient and medical records.  Cough Associated symptoms: headaches   Headache Associated symptoms: cough     Past Medical History:  Diagnosis Date   Anxiety    Depression    Medical history non-contributory    Sleep apnea    Suicidal ideation     Patient Active Problem List   Diagnosis Date Noted   Sleep apnea 04/27/2023    Past Surgical History:  Procedure Laterality Date   NO PAST SURGERIES      OB History     Gravida  1   Para  1   Term  1   Preterm      AB      Living  1      SAB      IAB      Ectopic      Multiple  0   Live Births  1            Home Medications    Prior to Admission medications   Medication Sig Start Date End Date Taking? Authorizing Provider  azelastine  (ASTELIN ) 0.1 % nasal spray Place 2 sprays into both nostrils 2 (two) times daily. Use in each nostril as directed  07/26/24  Yes Johnie Flaming A, NP  benzonatate  (TESSALON ) 100 MG capsule Take 1 capsule (100 mg total) by mouth every 8 (eight) hours. 07/26/24  Yes Johnie Flaming A, NP  Prenatal Vit-Fe Fumarate-FA (PREPLUS) 27-1 MG TABS Take 1 tablet by mouth daily. 01/15/24   Jhonny Augustin BROCKS, MD    Family History Family History  Problem Relation Age of Onset   Healthy Mother    Diabetes Father     Social History Social History   Tobacco Use   Smoking status: Former    Types: Cigars   Smokeless tobacco: Never  Vaping Use   Vaping status: Never Used  Substance Use Topics   Alcohol use: Not Currently    Comment: occ   Drug use: Never     Allergies   Patient has no known allergies.   Review of Systems Review of Systems  Respiratory:  Positive for cough.   Neurological:  Positive for headaches.   Per HPI  Physical Exam Triage Vital Signs ED Triage Vitals  Encounter Vitals Group     BP 07/26/24 1005 124/82  Girls Systolic BP Percentile --      Girls Diastolic BP Percentile --      Boys Systolic BP Percentile --      Boys Diastolic BP Percentile --      Pulse Rate 07/26/24 1005 88     Resp 07/26/24 1005 20     Temp 07/26/24 1005 98 F (36.7 C)     Temp src --      SpO2 07/26/24 1005 97 %     Weight --      Height --      Head Circumference --      Peak Flow --      Pain Score 07/26/24 1001 6     Pain Loc --      Pain Education --      Exclude from Growth Chart --    No data found.  Updated Vital Signs BP 124/82   Pulse 88   Temp 98 F (36.7 C)   Resp 20   LMP  (LMP Unknown) Comment: PT unsure when last period was. Pt has irreg. period and has not beensexualy active.  SpO2 97%   Visual Acuity Right Eye Distance:   Left Eye Distance:   Bilateral Distance:    Right Eye Near:   Left Eye Near:    Bilateral Near:     Physical Exam Vitals and nursing note reviewed.  Constitutional:      General: She is awake. She is not in acute distress.    Appearance:  Normal appearance. She is well-developed and well-groomed. She is not ill-appearing.  HENT:     Right Ear: Tympanic membrane, ear canal and external ear normal.     Left Ear: Tympanic membrane, ear canal and external ear normal.     Nose: Congestion and rhinorrhea present.     Mouth/Throat:     Mouth: Mucous membranes are moist.     Pharynx: Posterior oropharyngeal erythema and postnasal drip present. No oropharyngeal exudate.  Cardiovascular:     Rate and Rhythm: Normal rate and regular rhythm.  Pulmonary:     Effort: Pulmonary effort is normal.     Breath sounds: Normal breath sounds.  Skin:    General: Skin is warm and dry.  Neurological:     Mental Status: She is alert.  Psychiatric:        Behavior: Behavior is cooperative.      UC Treatments / Results  Labs (all labs ordered are listed, but only abnormal results are displayed) Labs Reviewed - No data to display  EKG   Radiology No results found.  Procedures Procedures (including critical care time)  Medications Ordered in UC Medications - No data to display  Initial Impression / Assessment and Plan / UC Course  I have reviewed the triage vital signs and the nursing notes.  Pertinent labs & imaging results that were available during my care of the patient were reviewed by me and considered in my medical decision making (see chart for details).     Patient is overall well-appearing. Vitals are stable. Exam findings consistent with viral illness. No signs of secondary bacterial infection at this time.  Prescribed Tessalon  as needed for cough. Prescribed azelastine  for congestion. Discussed OTC medication for symptoms. Discussed follow-up and return precautions.  Final Clinical Impressions(s) / UC Diagnoses   Final diagnoses:  Viral URI with cough     Discharge Instructions      A discussed I believe your symptoms are likely related  to a viral respiratory illness. I do not see any signs of bacterial  infection at this time.  You can take Tessalon  every 8 hours as needed for cough. You can use azelastine  nasal spray twice daily as needed for congestion.  You can also take over-the-counter Mucinex  as needed for cough and congestion.  I also recommend adding on a daily allergy medication such as Zyrtec (cetirizine) or Claritin (loratadine) to help with cough and congestion.    ED Prescriptions     Medication Sig Dispense Auth. Provider   benzonatate  (TESSALON ) 100 MG capsule Take 1 capsule (100 mg total) by mouth every 8 (eight) hours. 21 capsule Johnie, Milon Dethloff A, NP   azelastine  (ASTELIN ) 0.1 % nasal spray Place 2 sprays into both nostrils 2 (two) times daily. Use in each nostril as directed 30 mL Johnie Flaming A, NP      PDMP not reviewed this encounter.   Johnie Flaming A, NP 07/26/24 [phone]

## 2024-07-26 NOTE — Discharge Instructions (Signed)
 A discussed I believe your symptoms are likely related to a viral respiratory illness. I do not see any signs of bacterial infection at this time.  You can take Tessalon  every 8 hours as needed for cough. You can use azelastine  nasal spray twice daily as needed for congestion.  You can also take over-the-counter Mucinex  as needed for cough and congestion.  I also recommend adding on a daily allergy medication such as Zyrtec (cetirizine) or Claritin (loratadine) to help with cough and congestion.
